# Patient Record
Sex: Male | Born: 1965 | Race: White | Hispanic: No | Marital: Married | Smoking: Never smoker
Health system: Southern US, Community
[De-identification: ages and names within clinical notes are randomized; demographics above are authoritative.]

## PROBLEM LIST (undated history)

## (undated) ENCOUNTER — Emergency Department: Discharge status: Absent without leave | Payer: Self-pay

## (undated) DIAGNOSIS — K219 Gastro-esophageal reflux disease without esophagitis: Secondary | ICD-10-CM

## (undated) DIAGNOSIS — I10 Essential (primary) hypertension: Secondary | ICD-10-CM

## (undated) DIAGNOSIS — F319 Bipolar disorder, unspecified: Secondary | ICD-10-CM

## (undated) DIAGNOSIS — G4730 Sleep apnea, unspecified: Secondary | ICD-10-CM

## (undated) HISTORY — PX: KNEE ARTHROSCOPY: SHX127

## (undated) HISTORY — PX: WOUND DEBRIDEMENT: SHX247

---

## 2005-09-24 ENCOUNTER — Emergency Department: Payer: Self-pay | Admitting: Emergency Medicine

## 2007-11-04 ENCOUNTER — Emergency Department: Payer: Self-pay | Admitting: Emergency Medicine

## 2009-04-04 ENCOUNTER — Ambulatory Visit: Payer: Self-pay | Admitting: Internal Medicine

## 2009-06-20 ENCOUNTER — Ambulatory Visit: Payer: Self-pay | Admitting: Family Medicine

## 2009-12-01 ENCOUNTER — Ambulatory Visit: Payer: Self-pay | Admitting: Internal Medicine

## 2010-03-02 ENCOUNTER — Ambulatory Visit: Payer: Self-pay | Admitting: Family Medicine

## 2010-05-15 ENCOUNTER — Ambulatory Visit: Payer: Self-pay | Admitting: Internal Medicine

## 2011-05-21 ENCOUNTER — Ambulatory Visit: Payer: Self-pay

## 2011-10-01 ENCOUNTER — Ambulatory Visit: Payer: Self-pay

## 2012-09-22 ENCOUNTER — Emergency Department: Payer: Self-pay | Admitting: Unknown Physician Specialty

## 2012-09-22 ENCOUNTER — Ambulatory Visit: Payer: Self-pay

## 2012-09-22 LAB — URINALYSIS, COMPLETE
Bilirubin,UR: NEGATIVE
Glucose,UR: NEGATIVE mg/dL (ref 0–75)
Ketone: NEGATIVE
Ph: 7 (ref 4.5–8.0)
Squamous Epithelial: NONE SEEN
WBC UR: NONE SEEN /HPF (ref 0–5)

## 2012-09-22 LAB — CBC
MCHC: 34 g/dL (ref 32.0–36.0)
MCV: 82 fL (ref 80–100)
RDW: 13.5 % (ref 11.5–14.5)

## 2012-09-22 LAB — COMPREHENSIVE METABOLIC PANEL
Alkaline Phosphatase: 60 U/L (ref 50–136)
Bilirubin,Total: 1.1 mg/dL — ABNORMAL HIGH (ref 0.2–1.0)
Calcium, Total: 8.9 mg/dL (ref 8.5–10.1)
Co2: 24 mmol/L (ref 21–32)
EGFR (African American): >60
Osmolality: 276 (ref 275–301)
SGOT(AST): 31 U/L (ref 15–37)
Sodium: 138 mmol/L (ref 136–145)
Total Protein: 7.4 g/dL (ref 6.4–8.2)

## 2012-09-22 LAB — TROPONIN I: Troponin-I: <0.02 ng/mL

## 2012-09-22 LAB — PRO B NATRIURETIC PEPTIDE: B-Type Natriuretic Peptide: 40 pg/mL (ref 0–125)

## 2012-10-03 ENCOUNTER — Ambulatory Visit: Payer: Self-pay | Admitting: Family Medicine

## 2013-01-05 ENCOUNTER — Ambulatory Visit: Payer: Self-pay | Admitting: Family Medicine

## 2013-04-29 ENCOUNTER — Ambulatory Visit: Payer: Self-pay | Admitting: Physician Assistant

## 2017-03-01 ENCOUNTER — Emergency Department: Payer: Self-pay

## 2017-03-01 ENCOUNTER — Emergency Department
Admission: EM | Admit: 2017-03-01 | Discharge: 2017-03-01 | Disposition: A | Discharge status: Home / Self Care | Payer: Self-pay | Attending: Emergency Medicine | Admitting: Emergency Medicine

## 2017-03-01 DIAGNOSIS — R079 Chest pain, unspecified: Secondary | ICD-10-CM | POA: Insufficient documentation

## 2017-03-01 DIAGNOSIS — R42 Dizziness and giddiness: Secondary | ICD-10-CM | POA: Insufficient documentation

## 2017-03-01 DIAGNOSIS — I160 Hypertensive urgency: Secondary | ICD-10-CM | POA: Insufficient documentation

## 2017-03-01 DIAGNOSIS — I10 Essential (primary) hypertension: Secondary | ICD-10-CM | POA: Insufficient documentation

## 2017-03-01 LAB — CBC
HCT: 41.5 % (ref 40.0–52.0)
Hemoglobin: 14.1 g/dL (ref 13.0–18.0)
MCH: 27.3 pg (ref 26.0–34.0)
MCHC: 34.1 g/dL (ref 32.0–36.0)
MCV: 79.9 fL — AB (ref 80.0–100.0)
PLATELETS: 231 10*3/uL (ref 150–440)
RBC: 5.19 MIL/uL (ref 4.40–5.90)
RDW: 14 % (ref 11.5–14.5)
WBC: 10.5 10*3/uL (ref 3.8–10.6)

## 2017-03-01 LAB — BASIC METABOLIC PANEL WITH GFR
Anion gap: 7 (ref 5–15)
BUN: 21 mg/dL — ABNORMAL HIGH (ref 6–20)
CO2: 25 mmol/L (ref 22–32)
Calcium: 8.7 mg/dL — ABNORMAL LOW (ref 8.9–10.3)
Chloride: 108 mmol/L (ref 101–111)
Creatinine, Ser: 1.31 mg/dL — ABNORMAL HIGH (ref 0.61–1.24)
GFR calc Af Amer: >60 mL/min
GFR calc non Af Amer: >60 mL/min
Glucose, Bld: 123 mg/dL — ABNORMAL HIGH (ref 65–99)
Potassium: 3.6 mmol/L (ref 3.5–5.1)
Sodium: 140 mmol/L (ref 135–145)

## 2017-03-01 LAB — TROPONIN I
Troponin I: <0.03 ng/mL
Troponin I: <0.03 ng/mL

## 2017-03-01 MED ORDER — SODIUM CHLORIDE 0.9 % IV BOLUS (SEPSIS)
1000.0000 mL | Freq: Once | INTRAVENOUS | Status: AC
  Administered 2017-03-01: 1000 mL via INTRAVENOUS

## 2017-03-01 MED ORDER — ASPIRIN 81 MG PO TABS: 81.0000 mg | ORAL_TABLET | Freq: Every day | ORAL | 0 refills | Status: AC

## 2017-03-01 MED ORDER — LISINOPRIL 10 MG PO TABS
10.0000 mg | ORAL_TABLET | Freq: Once | ORAL | Status: AC
  Administered 2017-03-01: 10 mg via ORAL
  Filled 2017-03-01: qty 1

## 2017-03-01 MED ORDER — ASPIRIN 81 MG PO CHEW
324.0000 mg | CHEWABLE_TABLET | Freq: Once | ORAL | Status: AC
  Administered 2017-03-01: 324 mg via ORAL
  Filled 2017-03-01: qty 4

## 2017-03-01 NOTE — ED Provider Notes (Signed)
[facility] Emergency Department Provider Note  ____________________________________________  Time seen: Approximately 3:53 AM  I have reviewed the triage vital signs and the nursing notes.   HISTORY  Chief Complaint Chest Pain   HPI Dale Sawyer is a 51 y.o. male with h/o HTN who presents for evaluation of CP. Patient reports that he was at work not doing anything exertional when he developed CP that he describes a pressure in the center of his chest, radiating to his L arm associated with dizziness and trouble taking a deep breath. The symptoms lasted about 1 hour and have now resolved. He is supposed to be on lisinopril however does not take it daily. He noticed his BP elevated at work. Last took lisinopril 2 days ago. No h/o smoking. Unknown family history as patient is adopted. Patient reports that he has had several similar episodes in the last 5 years. No changes in frequency or intensity. Has never seen a cardiologist.  PMH HTN  No past surgical history on file.  Prior to Admission medications   Medication Sig Start Date End Date Taking? Authorizing Provider  aspirin 81 MG tablet Take 1 tablet (81 mg total) by mouth daily. 03/01/17   Nita Sickle, MD    Allergies Penicillins  Family History Unknown as patient is adopted  Social History No history of smoking, drugs. Occasional alcohol  Review of Systems  Constitutional: Negative for fever. Eyes: Negative for visual changes. ENT: Negative for sore throat. Neck: No neck pain  Cardiovascular: + chest pain. Respiratory: + shortness of breath. Gastrointestinal: Negative for abdominal pain, vomiting or diarrhea. Genitourinary: Negative for dysuria. Musculoskeletal: Negative for back pain. Skin: Negative for rash. Neurological: Negative for headaches, weakness or numbness. Psych: No SI or HI  ____________________________________________   PHYSICAL EXAM:  VITAL SIGNS: ED  Triage Vitals  Enc Vitals Group     BP 03/01/17 0255 (!) 192/118     Pulse Rate 03/01/17 0255 94     Resp 03/01/17 0255 20     Temp 03/01/17 0309 98.3 F (36.8 C)     Temp Source 03/01/17 0309 Oral     SpO2 03/01/17 0309 93 %     Weight 03/01/17 0255 240 lb (108.9 kg)     Height 03/01/17 0255  (1.727 m)     Head Circumference --      Peak Flow --      Pain Score 03/01/17 0254 2     Pain Loc --      Pain Edu? --      Excl. in GC? --     Constitutional: Alert and oriented. Well appearing and in no apparent distress. HEENT:      Head: Normocephalic and atraumatic.         Eyes: Conjunctivae are normal. Sclera is non-icteric.       Mouth/Throat: Mucous membranes are moist.       Neck: Supple with no signs of meningismus. Cardiovascular: Regular rate and rhythm. No murmurs, gallops, or rubs. 2+ symmetrical distal pulses are present in all extremities. No JVD. Respiratory: Normal respiratory effort. Lungs are clear to auscultation bilaterally. No wheezes, crackles, or rhonchi.  Gastrointestinal: Soft, non tender, and non distended with positive bowel sounds. No rebound or guarding. Genitourinary: No CVA tenderness. Musculoskeletal: Nontender with normal range of motion in all extremities. No edema, cyanosis, or erythema of extremities. Neurologic: Normal speech and language. Face is symmetric. Moving all extremities. No gross focal neurologic deficits are  appreciated. Skin: Skin is warm, dry and intact. No rash noted. Psychiatric: Mood and affect are normal. Speech and behavior are normal.  ____________________________________________   LABS (all labs ordered are listed, but only abnormal results are displayed)  Labs Reviewed  BASIC METABOLIC PANEL - Abnormal; Notable for the following:       Result Value   Glucose, Bld 123 (*)    BUN 21 (*)    Creatinine, Ser 1.31 (*)    Calcium 8.7 (*)    All other components within normal limits  CBC - Abnormal; Notable for the  following:    MCV 79.9 (*)    All other components within normal limits  TROPONIN I  TROPONIN I   ____________________________________________  EKG  ED ECG REPORT I, Nita Sickle, the attending physician, personally viewed and interpreted this ECG.  Normal sinus rhythm, rate of 90, normal intervals, normal axis, no ST elevations or depressions, Q waves on inferior leads. Occasional PVCs. No prior for comparison.  ____________________________________________  RADIOLOGY  CXR: Negative  ____________________________________________   PROCEDURES  Procedure(s) performed: None Procedures Critical Care performed:  None ____________________________________________   INITIAL IMPRESSION / ASSESSMENT AND PLAN / ED COURSE  51 y.o. male with h/o HTN who presents for evaluation of CP. Patient is asymptomatic at this time. Presentation concerning for anginal pain. Heart score of 4. EKG with no evidence of ischemia. First troponin is negative. We'll monitor on telemetry, will give aspirin. Patient is asymptomatic at this time. Discussed admission for inpatient stress test however if second troponin is negative and patient remains pain-free and would like to have this workup done as an outpatient.    ----------------------------------------- 3:50 AM on 03/01/2017 ----------------------------------------- OBSERVATION CARE: This patient is being placed under observation care for the following reasons: Chest pain with repeat testing to rule out ischemia  ----------------------------------------- 5:20 AM on 03/01/2017 ----------------------------------------- patient remains extremely well appearing, no chest pain. First troponin is negative. Pending second troponin.  ----------------------------------------- 7:20 AM on 03/01/2017 ----------------------------------------- END OF OBSERVATION STATUS: After an appropriate period of observation, this patient is being discharged due to the  following reason(s):  workup negative with 2 negative troponins. Normal chest x-ray and normal labs. Patient remains asymptomatic with no chest pain. Patient be referred to cardiology for further evaluation for angina. Recommended return to the ER if patient develops any further episodes of chest pain in the meantime.    Pertinent labs & imaging results that were available during my care of the patient were reviewed by me and considered in my medical decision making (see chart for details).    ____________________________________________   FINAL CLINICAL IMPRESSION(S) / ED DIAGNOSES  Final diagnoses:  Chest pain, unspecified type  Hypertensive urgency      NEW MEDICATIONS STARTED DURING THIS VISIT:  New Prescriptions   ASPIRIN 81 MG TABLET    Take 1 tablet (81 mg total) by mouth daily.     Note:  This document was prepared using Dragon voice recognition software and may include unintentional dictation errors.    Don Perking, [place], MD 03/01/17 [phone]

## 2017-03-01 NOTE — ED Triage Notes (Signed)
Pt states that while working tonight he started having chest pain and difficulty catching his breath, pt reports checking his bp at work with a reading of 180/120, pt reports sporadically taking his bp meds

## 2017-03-01 NOTE — ED Notes (Signed)
Patient is asleep on the stretcher. Rise and fall of the chest noted. Patient is easily aroused when spoken to. Patient does not appear to be in acute distress at this time.   

## 2017-03-01 NOTE — Discharge Instructions (Signed)

## 2019-03-06 IMAGING — CR DG CHEST 2V
2 series · 2 of 2 positions shown · non-contrast
Comparison: 09/22/2012 chest radiograph

CLINICAL DATA: Chest pain and shortness of breath.

EXAM:
CHEST  2 VIEW

[chest pa]
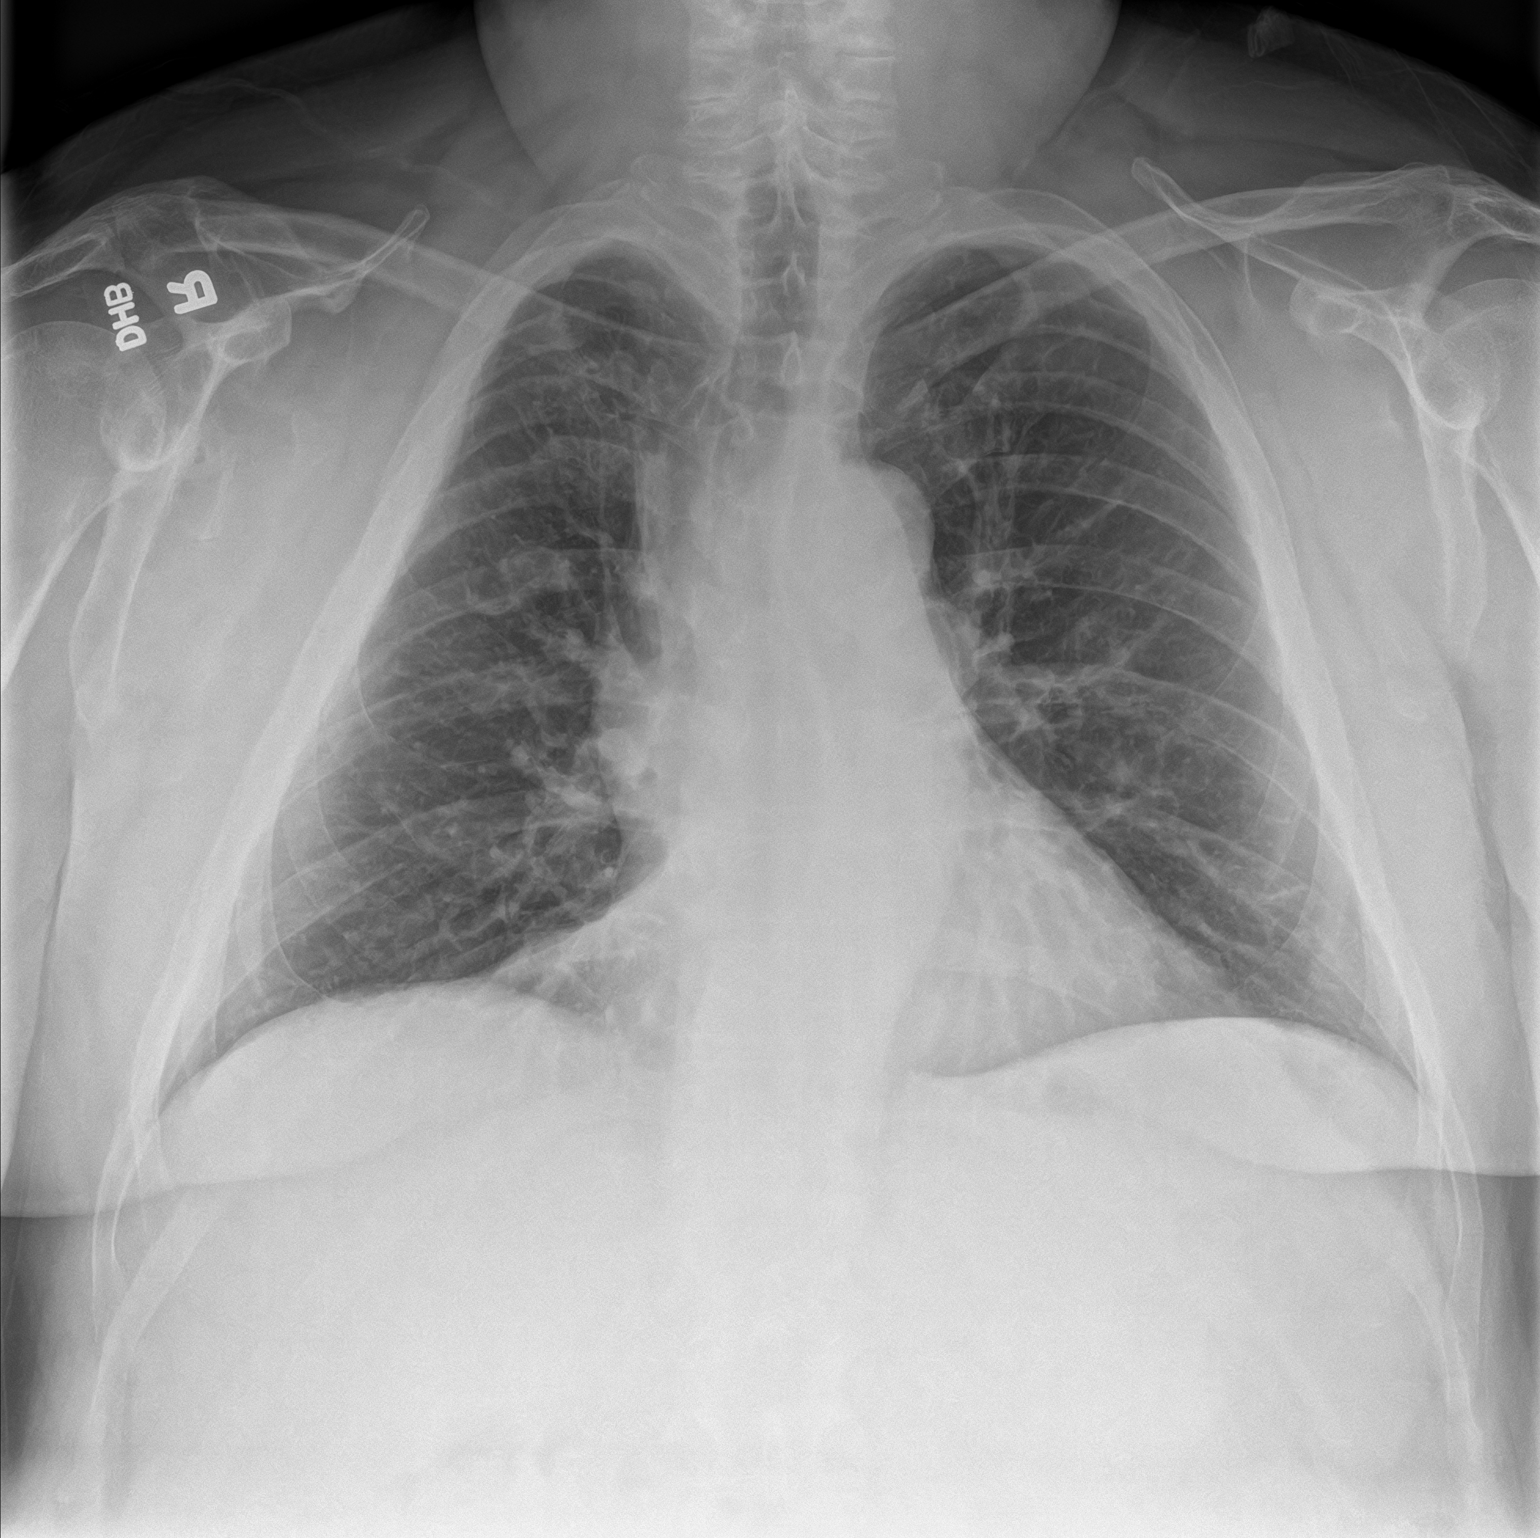

[chest lat]
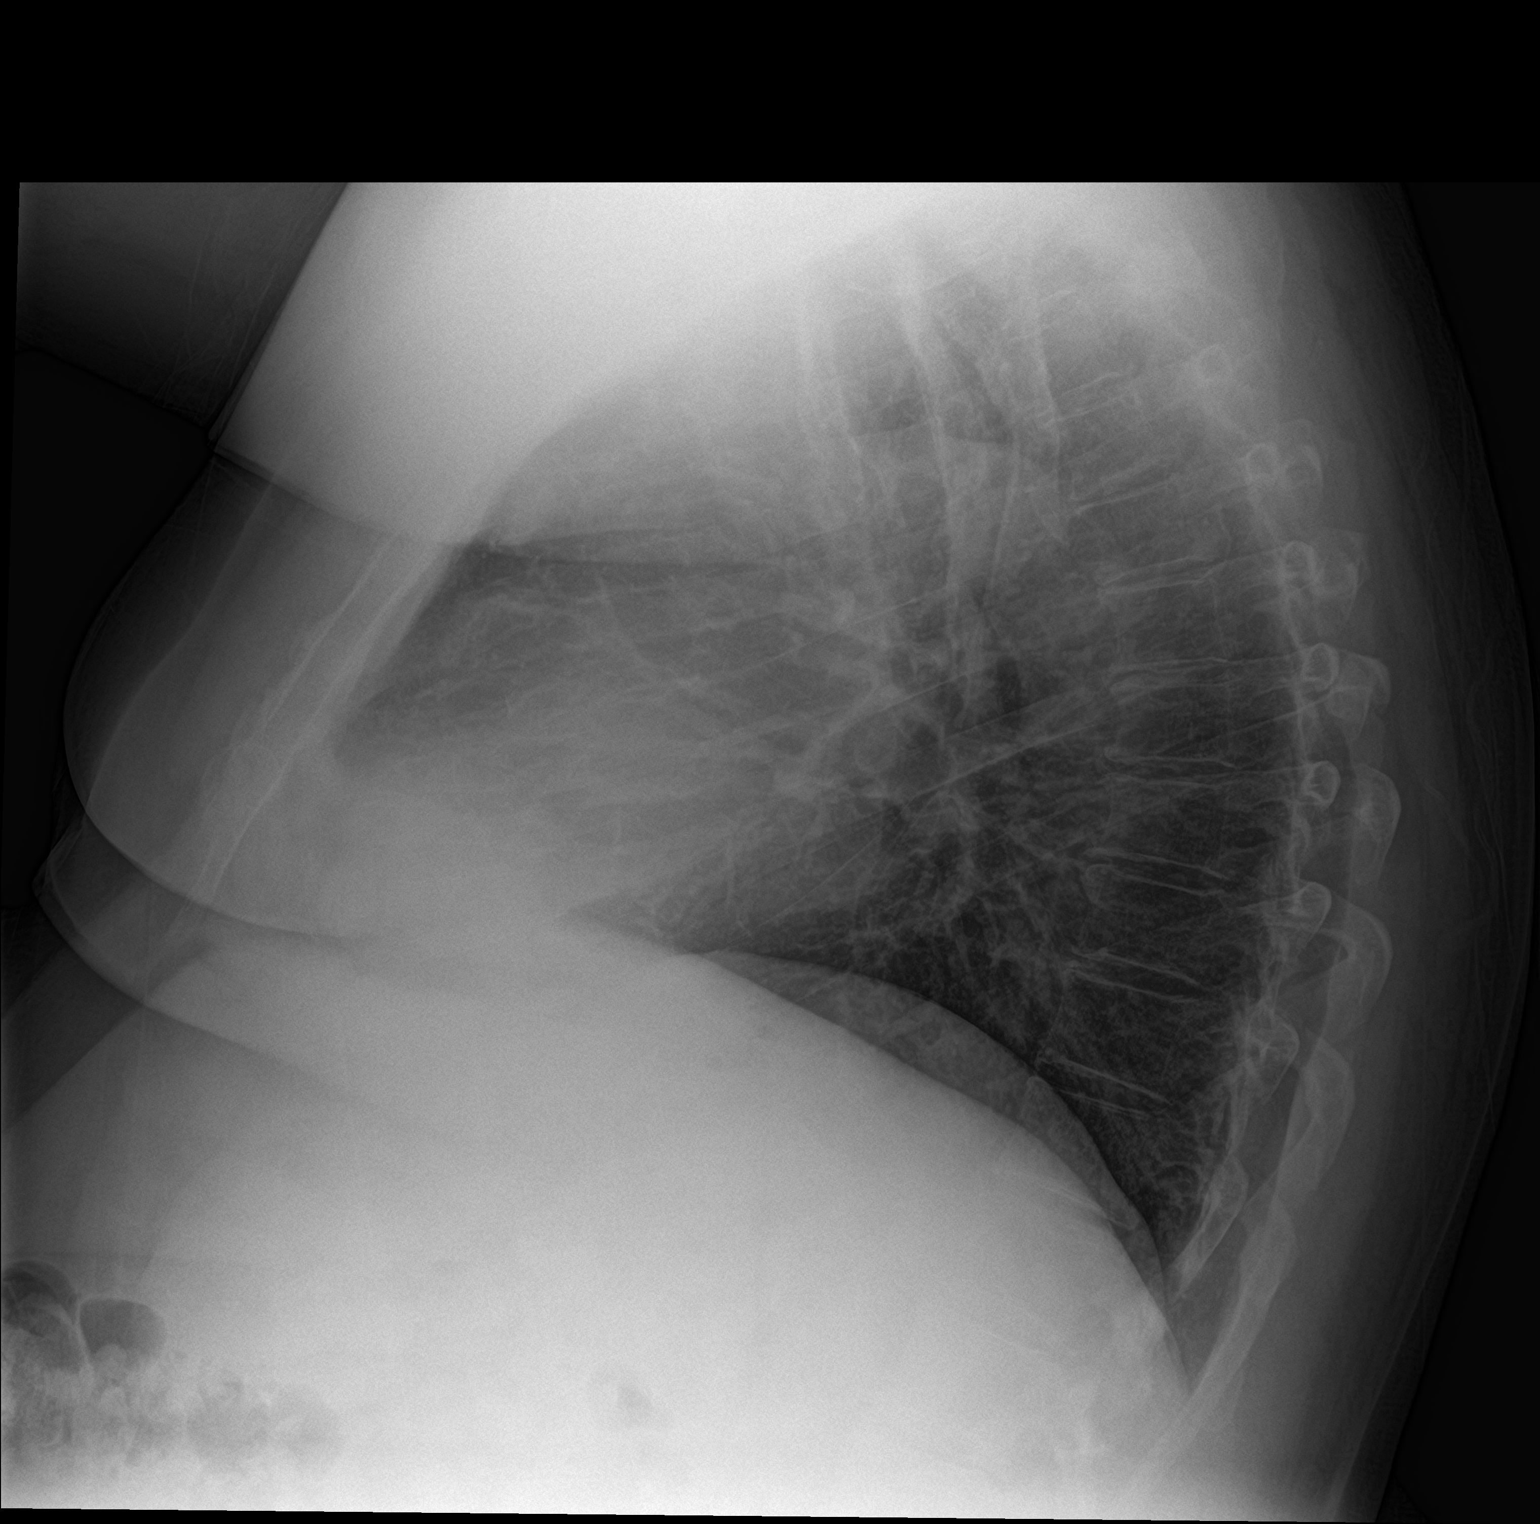

[2 of 2 positions shown; findings below may reference images not displayed]

FINDINGS: Multiple interval chronic right-sided rib fracture deformities and
deformity of the chest. Stable normal cardiac silhouette. No focal
consolidation. No pleural effusion or pneumothorax. No acute osseous
abnormality is evident.
IMPRESSION: No acute pulmonary process identified.

By: Olvin Antonio Okeefe M.D.

## 2019-08-31 ENCOUNTER — Ambulatory Visit: Payer: Self-pay | Attending: Internal Medicine

## 2019-08-31 DIAGNOSIS — Z23 Encounter for immunization: Secondary | ICD-10-CM

## 2019-08-31 NOTE — Progress Notes (Signed)
   Covid-19 Vaccination Clinic  Name:  Dale Sawyer    MRN: [id number] DOB: February 11, 1966  08/31/2019  [name] was observed post Covid-19 immunization for 15 minutes without incident. He was provided with Vaccine Information Sheet and instruction to access the V-Safe system.   [name] was instructed to call 911 with any severe reactions post vaccine: Marland Kitchen Difficulty breathing  . Swelling of face and throat  . A fast heartbeat  . A bad rash all over body  . Dizziness and weakness   Immunizations Administered    Name Date Dose VIS Date Route   Pfizer COVID-19 Vaccine 08/31/2019  1:46 PM 0.3 mL 05/15/2019 Intramuscular   Manufacturer: [employer], [employer]   Lot: [id number]   NDC: [address]-5

## 2019-09-23 ENCOUNTER — Ambulatory Visit: Payer: Self-pay | Attending: Internal Medicine

## 2019-09-23 DIAGNOSIS — Z23 Encounter for immunization: Secondary | ICD-10-CM

## 2019-09-23 NOTE — Progress Notes (Signed)
   Covid-19 Vaccination Clinic  Name:  Dale Sawyer    MRN: [id number] DOB: January 27, 1966  09/23/2019  [name] was observed post Covid-19 immunization for 15 minutes without incident. He was provided with Vaccine Information Sheet and instruction to access the V-Safe system.   [name] was instructed to call 911 with any severe reactions post vaccine: Marland Kitchen Difficulty breathing  . Swelling of face and throat  . A fast heartbeat  . A bad rash all over body  . Dizziness and weakness   Immunizations Administered    Name Date Dose VIS Date Route   Pfizer COVID-19 Vaccine 09/23/2019  2:46 PM 0.3 mL 07/29/2018 Intramuscular   Manufacturer: [employer], [employer]   Lot: [id number]   NDC: [address]-7

## 2020-06-17 ENCOUNTER — Other Ambulatory Visit: Payer: Self-pay

## 2020-06-17 ENCOUNTER — Emergency Department: Payer: Self-pay

## 2020-06-17 ENCOUNTER — Emergency Department
Admission: EM | Admit: 2020-06-17 | Discharge: 2020-06-17 | Disposition: A | Discharge status: Home / Self Care | Payer: Self-pay | Attending: Student in an Organized Health Care Education/Training Program | Admitting: Student in an Organized Health Care Education/Training Program

## 2020-06-17 DIAGNOSIS — R0602 Shortness of breath: Secondary | ICD-10-CM | POA: Insufficient documentation

## 2020-06-17 DIAGNOSIS — R030 Elevated blood-pressure reading, without diagnosis of hypertension: Secondary | ICD-10-CM | POA: Insufficient documentation

## 2020-06-17 DIAGNOSIS — Z5321 Procedure and treatment not carried out due to patient leaving prior to being seen by health care provider: Secondary | ICD-10-CM | POA: Insufficient documentation

## 2020-06-17 DIAGNOSIS — R251 Tremor, unspecified: Secondary | ICD-10-CM | POA: Insufficient documentation

## 2020-06-17 LAB — CBC
HCT: 43.5 % (ref 39.0–52.0)
Hemoglobin: 14.7 g/dL (ref 13.0–17.0)
MCH: 27.8 pg (ref 26.0–34.0)
MCHC: 33.8 g/dL (ref 30.0–36.0)
MCV: 82.4 fL (ref 80.0–100.0)
Platelets: 261 10*3/uL (ref 150–400)
RBC: 5.28 MIL/uL (ref 4.22–5.81)
RDW: 13.2 % (ref 11.5–15.5)
WBC: 12 10*3/uL — ABNORMAL HIGH (ref 4.0–10.5)
nRBC: 0 % (ref 0.0–0.2)

## 2020-06-17 LAB — COMPREHENSIVE METABOLIC PANEL
ALT: 41 U/L (ref 0–44)
AST: 22 U/L (ref 15–41)
Albumin: 4.4 g/dL (ref 3.5–5.0)
Alkaline Phosphatase: 47 U/L (ref 38–126)
Anion gap: 10 (ref 5–15)
BUN: 18 mg/dL (ref 6–20)
CO2: 25 mmol/L (ref 22–32)
Calcium: 8.5 mg/dL — ABNORMAL LOW (ref 8.9–10.3)
Chloride: 106 mmol/L (ref 98–111)
Creatinine, Ser: 1.11 mg/dL (ref 0.61–1.24)
GFR, Estimated: >60 mL/min (ref >60)
Glucose, Bld: 96 mg/dL (ref 70–99)
Potassium: 3.6 mmol/L (ref 3.5–5.1)
Sodium: 141 mmol/L (ref 135–145)
Total Bilirubin: 1.4 mg/dL — ABNORMAL HIGH (ref 0.3–1.2)
Total Protein: 7.8 g/dL (ref 6.5–8.1)

## 2020-06-17 LAB — TROPONIN I (HIGH SENSITIVITY): Troponin I (High Sensitivity): 5 ng/L (ref <18)

## 2020-06-17 NOTE — ED Triage Notes (Signed)
Pt states was at work and started feeling shaky and shob. States checked his bp and was elevated. Denies any recent illness or other complaints.

## 2021-01-12 LAB — COLOGUARD: COLOGUARD: NEGATIVE

## 2021-01-12 LAB — EXTERNAL GENERIC LAB PROCEDURE: COLOGUARD: NEGATIVE

## 2021-03-01 ENCOUNTER — Ambulatory Visit (INDEPENDENT_AMBULATORY_CARE_PROVIDER_SITE_OTHER): Payer: PRIVATE HEALTH INSURANCE | Admitting: Urology

## 2021-03-01 ENCOUNTER — Other Ambulatory Visit: Payer: Self-pay

## 2021-03-01 ENCOUNTER — Encounter: Payer: Self-pay | Admitting: Urology

## 2021-03-01 VITALS — BP 172/114 | HR 90 | Ht 68.0 in | Wt 255.0 lb

## 2021-03-01 DIAGNOSIS — N509 Disorder of male genital organs, unspecified: Secondary | ICD-10-CM

## 2021-03-01 DIAGNOSIS — R3129 Other microscopic hematuria: Secondary | ICD-10-CM | POA: Diagnosis not present

## 2021-03-01 DIAGNOSIS — N433 Hydrocele, unspecified: Secondary | ICD-10-CM

## 2021-03-01 NOTE — H&P (View-Only) (Signed)
   03/01/2021 7:10 AM   Dale Sawyer 09/26/1965 [id number]  Referring provider: Anderson, Marshall W, MD [address] [facility] [place],  [place] [address]  Chief Complaint  Patient presents with   Hydrocele    HPI: Dale Sawyer is a 55 y.o. male referred for hydrocele evaluation.  2-year history of "growth" posterior scrotum Increasing size the last 2 years and has become bothersome No bothersome LUTS Denies dysuria, gross hematuria  PMH: Hypertension  Surgical History: History reviewed. No pertinent surgical history.  Home Medications:  Allergies as of 03/01/2021       Reactions   Lisinopril Cough   Penicillins         Medication List        Accurate as of March 01, 2021 11:59 PM. If you have any questions, ask your nurse or doctor.          aspirin 81 MG tablet Take 1 tablet (81 mg total) by mouth daily.   colchicine 0.6 MG tablet Take 2 tablets (1.2mg) by mouth at first sign of gout flare followed by 1 tablet (0.6mg) after 1 hour. (Max 1.8mg within 1 hour)   losartan-hydrochlorothiazide 100-12.5 MG tablet Commonly known as: HYZAAR Take 1 tablet by mouth daily.   pantoprazole 40 MG tablet Commonly known as: PROTONIX Take 40 mg by mouth daily.        Allergies:  Allergies  Allergen Reactions   Lisinopril Cough   Penicillins     Family History: History reviewed. No pertinent family history.  Social History:  reports that he has never smoked. He has never used smokeless tobacco. No history on file for alcohol use and drug use.   Physical Exam: BP (!) 172/114   Pulse 90   Ht 5' 8" (1.727 m)   Wt 255 lb (115.7 kg)   BMI 38.77 kg/m   Constitutional:  Alert and oriented, No acute distress. HEENT: [place] AT, moist mucus membranes.  Trachea midline, no masses. Cardiovascular: RRR Respiratory: Normal respiratory effort, clear GI: Abdomen is soft, nontender, nondistended, no abdominal masses GU: Phallus  without lesions, testes descended bilaterally without masses or tenderness.  Right posterior scrotum/perineal area is an ~2 cm skin tag with broad stalk Psychiatric: Normal mood and affect.   Assessment & Plan:   55 y.o. male with a large right posterior scrotal/perineal skin tag who desires excision This could be performed in the office under local or in same-day surgery under sedation.  He has elected the latter. The procedure was discussed in detail including potential risks of bleeding and infection. Postoperative care was discussed. Urinalysis also showed 3-10 RBCs per high-power field.  AUA hematuria risk stratification-intermediate.  Will schedule renal ultrasound and recommend cystoscopy at the time of scrotal skin lesion excision   Keyaira Clapham C Huxton Glaus, MD  [facility] [address], [address] [place], [place] [address] [phone]   

## 2021-03-01 NOTE — Progress Notes (Signed)
   03/01/2021 7:10 AM   Dale Sawyer March 08, 1966 [id number]  Referring provider: Lauro Regulus, MD [address] [facility] [place] [facility] [place],  [place] [address]  Chief Complaint  Patient presents with   Hydrocele    HPI: Dale Sawyer is a 55 y.o. male referred for hydrocele evaluation.  2-year history of "growth" posterior scrotum Increasing size the last 2 years and has become bothersome No bothersome LUTS Denies dysuria, gross hematuria  PMH: Hypertension  Surgical History: History reviewed. No pertinent surgical history.  Home Medications:  Allergies as of 03/01/2021       Reactions   Lisinopril Cough   Penicillins         Medication List        Accurate as of March 01, 2021 11:59 PM. If you have any questions, ask your nurse or doctor.          aspirin 81 MG tablet Take 1 tablet (81 mg total) by mouth daily.   colchicine 0.6 MG tablet Take 2 tablets (1.2mg ) by mouth at first sign of gout flare followed by 1 tablet (0.6mg ) after 1 hour. (Max 1.8mg  within 1 hour)   losartan-hydrochlorothiazide 100-12.5 MG tablet Commonly known as: HYZAAR Take 1 tablet by mouth daily.   pantoprazole 40 MG tablet Commonly known as: PROTONIX Take 40 mg by mouth daily.        Allergies:  Allergies  Allergen Reactions   Lisinopril Cough   Penicillins     Family History: History reviewed. No pertinent family history.  Social History:  reports that he has never smoked. He has never used smokeless tobacco. No history on file for alcohol use and drug use.   Physical Exam: BP (!) 172/114   Pulse 90   Ht 5\' 8"  (1.727 m)   Wt 255 lb (115.7 kg)   BMI 38.77 kg/m   Constitutional:  Alert and oriented, No acute distress. HEENT: [name] AT, moist mucus membranes.  Trachea midline, no masses. Cardiovascular: RRR Respiratory: Normal respiratory effort, clear GI: Abdomen is soft, nontender, nondistended, no abdominal masses GU: Phallus  without lesions, testes descended bilaterally without masses or tenderness.  Right posterior scrotum/perineal area is an ~2 cm skin tag with broad stalk Psychiatric: Normal mood and affect.   Assessment & Plan:   55 y.o. male with a large right posterior scrotal/perineal skin tag who desires excision This could be performed in the office under local or in same-day surgery under sedation.  He has elected the latter. The procedure was discussed in detail including potential risks of bleeding and infection. Postoperative care was discussed. Urinalysis also showed 3-10 RBCs per high-power field.  AUA hematuria risk stratification-intermediate.  Will schedule renal ultrasound and recommend cystoscopy at the time of scrotal skin lesion excision   58, MD  [facility] [address], [address] [place], [place] [phone]

## 2021-03-02 LAB — URINALYSIS, COMPLETE
Bilirubin, UA: NEGATIVE
Glucose, UA: NEGATIVE
Ketones, UA: NEGATIVE
Leukocytes,UA: NEGATIVE
Nitrite, UA: NEGATIVE
Protein,UA: NEGATIVE
Specific Gravity, UA: 1.025 (ref 1.005–1.030)
Urobilinogen, Ur: 0.2 mg/dL (ref 0.2–1.0)
pH, UA: 5.5 (ref 5.0–7.5)

## 2021-03-02 LAB — MICROSCOPIC EXAMINATION
Bacteria, UA: NONE SEEN
Epithelial Cells (non renal): NONE SEEN /hpf (ref 0–10)

## 2021-03-05 ENCOUNTER — Telehealth: Payer: Self-pay | Admitting: Urology

## 2021-03-05 ENCOUNTER — Other Ambulatory Visit: Payer: Self-pay | Admitting: Urology

## 2021-03-05 DIAGNOSIS — R3129 Other microscopic hematuria: Secondary | ICD-10-CM

## 2021-03-05 DIAGNOSIS — N509 Disorder of male genital organs, unspecified: Secondary | ICD-10-CM

## 2021-03-05 NOTE — Progress Notes (Signed)
Surgical Physician Order Form  ** Scheduling expectation : Next Available  *Length of Case: 45 minutes  *Clearance needed: no  *Anticoagulation Instructions: N/A  *Aspirin Instructions: Hold Aspirin  *Post-op visit Date/Instructions:  1 month follow up  *Diagnosis:  1.  Scrotal skin lesion 2.  Microhematuria  *Procedure:  1.  Excision of scrotal skin lesion 2.  Flexible cystoscopy  -Admit type: OUTpatient  -Anesthesia: MAC  -VTE Prophylaxis Standing Order SCD's       Other:   -Standing Lab Orders Per Anesthesia    Lab other: None  -Standing Test orders EKG/Chest x-ray per Anesthesia       Test other:   - Medications:     Ancef 2gm IV (okay with penicillin allergy)   Other Instructions:

## 2021-03-05 NOTE — Telephone Encounter (Signed)
Please let patient know his urinalysis did show microscopic blood in the urine which is considered abnormal.  I have placed an order for a renal ultrasound and would also recommend cystoscopy at the time of his scrotal surgery.  Please let me know if he has any questions.

## 2021-03-06 NOTE — Telephone Encounter (Signed)
Notified patient as instructed, patient pleased. Discussed follow-up appointments, patient agrees  

## 2021-03-07 ENCOUNTER — Telehealth: Payer: Self-pay | Admitting: Urology

## 2021-03-07 ENCOUNTER — Encounter: Payer: Self-pay | Admitting: Urology

## 2021-03-07 NOTE — Telephone Encounter (Signed)
Per Dr. Lonna Cobb Patient is to be scheduled for Excision of scrotal skin lesion, and a Flexible Cystoscopy.  Mr. Dale Sawyer was contacted and possible surgical dates were discussed, 03/14/21 was agreed upon for surgery.   Patient was directed to call [phone] between 1-3pm the day before surgery to find out surgical arrival time.  Instructions were given not to eat or drink from midnight on the night before surgery and have a driver for the day of surgery. On the surgery day patient was instructed to enter through the Medical Mall entrance of [facility] report the Same Day Surgery desk.   Pre-Admit Testing will be in contact via phone to set up an interview with the anesthesia team to review your history and medications prior to surgery.   Reminder of this information was sent via mychart and mail to the patient.   Patient is not to hold anticoag's per Dr. Lonna Cobb.   Currently taking ASA 81mg . Pt. Needs to hold this 7 days prior to surgery.

## 2021-03-07 NOTE — Progress Notes (Signed)
[facility] Posting Form   Surgery Date/Time: Date: 03/14/2021  Surgeon: Dr. Irineo Axon, MD  Surgery Location: Day Surgery  Inpt ( No  )   Outpt (Yes)   Obs ( No  )   Diagnosis: N50.9 Scrotal Skin Lesion; R31.29 Microhematuria    -CPT: 55150  Surgery: Excision of Scrotal Skin Lesion  -CPT: 52000  Surgery: Flexible Cystoscopy  Stop Anticoagulations: No; hold aspirin  Cardiac/Medical/Pulmonary Clearance needed: None needed  *Orders entered into EPIC  Date: 03/07/21   *Case booked in EPIC  Date: 03/07/21  *Notified pt of Surgery: Date: 03/07/21  PRE-OP UA & CX: None  *Placed into Prior Authorization Work Que Date: 03/07/21   Assistant/laser/rep:No

## 2021-03-07 NOTE — Telephone Encounter (Signed)
Orders for surgery entered and sent to Pre-Admit.

## 2021-03-09 ENCOUNTER — Encounter
Admission: RE | Admit: 2021-03-09 | Discharge: 2021-03-09 | Disposition: A | Discharge status: Home / Self Care | Payer: PRIVATE HEALTH INSURANCE | Source: Ambulatory Visit | Attending: Urology | Admitting: Urology

## 2021-03-09 ENCOUNTER — Other Ambulatory Visit: Payer: Self-pay

## 2021-03-09 HISTORY — DX: Bipolar disorder, unspecified: F31.9

## 2021-03-09 HISTORY — DX: Essential (primary) hypertension: I10

## 2021-03-09 HISTORY — DX: Gastro-esophageal reflux disease without esophagitis: K21.9

## 2021-03-09 HISTORY — DX: Sleep apnea, unspecified: G47.30

## 2021-03-09 NOTE — Patient Instructions (Signed)
Your procedure is scheduled on: 03/14/21 Report to DAY SURGERY DEPARTMENT LOCATED ON 2ND FLOOR MEDICAL MALL ENTRANCE. To find out your arrival time please call [phone] between 1PM - 3PM on 03/13/21.  Remember: Instructions that are not followed completely may result in serious medical risk, up to and including death, or upon the discretion of your surgeon and anesthesiologist your surgery may need to be rescheduled.     _X__ 1. Do not eat food OR DRINK LIQUIDS after midnight the night before your procedure.                 No gum chewing or hard candies.   __X__2.  On the morning of surgery brush your teeth with toothpaste and water, you                 may rinse your mouth with mouthwash if you wish.  Do not swallow any              toothpaste of mouthwash.     _X__ 3.  No Alcohol for 24 hours before or after surgery.   _X__ 4.  Do Not Smoke or use e-cigarettes For 24 Hours Prior to Your Surgery.                 Do not use any chewable tobacco products for at least 6 hours prior to                 surgery.  ____  5.  Bring all medications with you on the day of surgery if instructed.   __X__  6.  Notify your doctor if there is any change in your medical condition      (cold, fever, infections).     Do not wear jewelry, make-up, hairpins, clips or nail polish. Do not wear lotions, powders, or perfumes.  Do not shave body hair 48 hours prior to surgery. Men may shave face and neck. Do not bring valuables to the hospital.    [facility] is not responsible for any belongings or valuables.  Contacts, dentures/partials or body piercings may not be worn into surgery. Bring a case for your contacts, glasses or hearing aids, a denture cup will be supplied. Leave your suitcase in the car. After surgery it may be brought to your room. For patients admitted to the hospital, discharge time is determined by your treatment team.   Patients discharged the day of surgery will not be allowed  to drive home.   Please read over the following fact sheets that you were given:     __X__ Take these medicines the morning of surgery with A SIP OF WATER:    1. lamoTRIgine (LAMICTAL) 200 MG tablet  2. pantoprazole (PROTONIX) 40 MG tablet  3.   4.  5.  6.  ____ Fleet Enema (as directed)   ____ Use CHG Soap/SAGE wipes as directed  ____ Use inhalers on the day of surgery  ____ Stop metformin/Janumet/Farxiga 2 days prior to surgery    ____ Take 1/2 of usual insulin dose the night before surgery. No insulin the morning          of surgery.   ____ Stop Blood Thinners Coumadin/Plavix/Xarelto/Pleta/Pradaxa/Eliquis/Effient/Aspirin  on   Or contact your Surgeon, Cardiologist or Medical Doctor regarding  ability to stop your blood thinners  __X__ Stop Anti-inflammatories 7 days before surgery such as Advil, Ibuprofen, Motrin,  BC or Goodies Powder, Naprosyn, Naproxen, Aleve, Aspirin    __X__ Stop  all herbal supplements, fish oil or vitamin E until after surgery.    ____ Bring C-Pap to the hospital.    HOLD ASPIRIN UNTIL AFTER SURGERY

## 2021-03-10 ENCOUNTER — Other Ambulatory Visit
Admission: RE | Admit: 2021-03-10 | Discharge: 2021-03-10 | Disposition: A | Discharge status: Home / Self Care | Payer: PRIVATE HEALTH INSURANCE | Source: Ambulatory Visit | Attending: Urology | Admitting: Urology

## 2021-03-10 DIAGNOSIS — I10 Essential (primary) hypertension: Secondary | ICD-10-CM | POA: Insufficient documentation

## 2021-03-10 DIAGNOSIS — Z01818 Encounter for other preprocedural examination: Secondary | ICD-10-CM | POA: Insufficient documentation

## 2021-03-10 LAB — BASIC METABOLIC PANEL
Anion gap: 10 (ref 5–15)
BUN: 23 mg/dL — ABNORMAL HIGH (ref 6–20)
CO2: 27 mmol/L (ref 22–32)
Calcium: 9.4 mg/dL (ref 8.9–10.3)
Chloride: 101 mmol/L (ref 98–111)
Creatinine, Ser: 1.26 mg/dL — ABNORMAL HIGH (ref 0.61–1.24)
GFR, Estimated: >60 mL/min (ref >60)
Glucose, Bld: 96 mg/dL (ref 70–99)
Potassium: 3.7 mmol/L (ref 3.5–5.1)
Sodium: 138 mmol/L (ref 135–145)

## 2021-03-14 ENCOUNTER — Encounter: Payer: Self-pay | Admitting: Urology

## 2021-03-14 ENCOUNTER — Other Ambulatory Visit: Payer: Self-pay

## 2021-03-14 ENCOUNTER — Encounter
Admission: RE | Disposition: A | Discharge status: Home / Self Care | Payer: Self-pay | Source: Home / Self Care | Attending: Urology

## 2021-03-14 ENCOUNTER — Ambulatory Visit
Admission: RE | Admit: 2021-03-14 | Discharge: 2021-03-14 | Disposition: A | Discharge status: Home / Self Care | Payer: 59 | Attending: Urology | Admitting: Urology

## 2021-03-14 ENCOUNTER — Ambulatory Visit: Payer: 59 | Admitting: Anesthesiology

## 2021-03-14 DIAGNOSIS — G4730 Sleep apnea, unspecified: Secondary | ICD-10-CM | POA: Diagnosis not present

## 2021-03-14 DIAGNOSIS — K219 Gastro-esophageal reflux disease without esophagitis: Secondary | ICD-10-CM | POA: Insufficient documentation

## 2021-03-14 DIAGNOSIS — L989 Disorder of the skin and subcutaneous tissue, unspecified: Secondary | ICD-10-CM | POA: Diagnosis not present

## 2021-03-14 DIAGNOSIS — Z79899 Other long term (current) drug therapy: Secondary | ICD-10-CM | POA: Insufficient documentation

## 2021-03-14 DIAGNOSIS — Z880 Allergy status to penicillin: Secondary | ICD-10-CM | POA: Diagnosis not present

## 2021-03-14 DIAGNOSIS — N433 Hydrocele, unspecified: Secondary | ICD-10-CM | POA: Diagnosis present

## 2021-03-14 DIAGNOSIS — N509 Disorder of male genital organs, unspecified: Secondary | ICD-10-CM

## 2021-03-14 DIAGNOSIS — R3129 Other microscopic hematuria: Secondary | ICD-10-CM | POA: Insufficient documentation

## 2021-03-14 DIAGNOSIS — L918 Other hypertrophic disorders of the skin: Secondary | ICD-10-CM | POA: Diagnosis not present

## 2021-03-14 DIAGNOSIS — Z888 Allergy status to other drugs, medicaments and biological substances status: Secondary | ICD-10-CM | POA: Diagnosis not present

## 2021-03-14 DIAGNOSIS — Z7982 Long term (current) use of aspirin: Secondary | ICD-10-CM | POA: Insufficient documentation

## 2021-03-14 DIAGNOSIS — I10 Essential (primary) hypertension: Secondary | ICD-10-CM | POA: Diagnosis not present

## 2021-03-14 HISTORY — PX: SCROTAL EXPLORATION: SHX2386

## 2021-03-14 HISTORY — PX: CYSTOSCOPY: SHX5120

## 2021-03-14 SURGERY — EXPLORATION, SCROTUM
Anesthesia: General

## 2021-03-14 MED ORDER — PROPOFOL 500 MG/50ML IV EMUL
INTRAVENOUS | Status: AC
  Filled 2021-03-14: qty 50

## 2021-03-14 MED ORDER — PROPOFOL 500 MG/50ML IV EMUL
INTRAVENOUS | Status: DC | PRN
  Administered 2021-03-14: 150 ug/kg/min via INTRAVENOUS

## 2021-03-14 MED ORDER — CEFAZOLIN SODIUM-DEXTROSE 2-4 GM/100ML-% IV SOLN
2.0000 g | INTRAVENOUS | Status: AC
  Administered 2021-03-14: 2 g via INTRAVENOUS

## 2021-03-14 MED ORDER — FENTANYL CITRATE (PF) 100 MCG/2ML IJ SOLN
INTRAMUSCULAR | Status: DC | PRN
  Administered 2021-03-14: 25 ug via INTRAVENOUS

## 2021-03-14 MED ORDER — LACTATED RINGERS IV SOLN: INTRAVENOUS | Status: DC

## 2021-03-14 MED ORDER — ORAL CARE MOUTH RINSE: 15.0000 mL | Freq: Once | OROMUCOSAL | Status: AC

## 2021-03-14 MED ORDER — ACETAMINOPHEN 10 MG/ML IV SOLN
INTRAVENOUS | Status: DC | PRN
  Administered 2021-03-14: 1000 mg via INTRAVENOUS

## 2021-03-14 MED ORDER — LIDOCAINE HCL (PF) 1 % IJ SOLN
INTRAMUSCULAR | Status: AC
  Filled 2021-03-14: qty 30

## 2021-03-14 MED ORDER — LIDOCAINE HCL (PF) 2 % IJ SOLN
INTRAMUSCULAR | Status: AC
  Filled 2021-03-14: qty 5

## 2021-03-14 MED ORDER — CHLORHEXIDINE GLUCONATE 0.12 % MT SOLN: 15.0000 mL | Freq: Once | OROMUCOSAL | Status: AC

## 2021-03-14 MED ORDER — FENTANYL CITRATE (PF) 100 MCG/2ML IJ SOLN
INTRAMUSCULAR | Status: AC
  Filled 2021-03-14: qty 2

## 2021-03-14 MED ORDER — PROPOFOL 10 MG/ML IV BOLUS
INTRAVENOUS | Status: DC | PRN
  Administered 2021-03-14: 50 mg via INTRAVENOUS

## 2021-03-14 MED ORDER — MIDAZOLAM HCL 2 MG/2ML IJ SOLN
INTRAMUSCULAR | Status: DC | PRN
  Administered 2021-03-14: 2 mg via INTRAVENOUS

## 2021-03-14 MED ORDER — CEFAZOLIN SODIUM-DEXTROSE 2-4 GM/100ML-% IV SOLN
INTRAVENOUS | Status: AC
  Filled 2021-03-14: qty 100

## 2021-03-14 MED ORDER — FENTANYL CITRATE (PF) 100 MCG/2ML IJ SOLN: 25.0000 ug | INTRAMUSCULAR | Status: DC | PRN

## 2021-03-14 MED ORDER — CHLORHEXIDINE GLUCONATE 0.12 % MT SOLN
OROMUCOSAL | Status: AC
  Administered 2021-03-14: 15 mL via OROMUCOSAL
  Filled 2021-03-14: qty 15

## 2021-03-14 MED ORDER — ACETAMINOPHEN 10 MG/ML IV SOLN
INTRAVENOUS | Status: AC
  Filled 2021-03-14: qty 100

## 2021-03-14 MED ORDER — MIDAZOLAM HCL 2 MG/2ML IJ SOLN
INTRAMUSCULAR | Status: AC
  Filled 2021-03-14: qty 2

## 2021-03-14 MED ORDER — HYDROCODONE-ACETAMINOPHEN 5-325 MG PO TABS: 1.0000 | ORAL_TABLET | Freq: Four times a day (QID) | ORAL | 0 refills | Status: AC | PRN

## 2021-03-14 SURGICAL SUPPLY — 27 items
ADH SKN CLS APL DERMABOND .7 (GAUZE/BANDAGES/DRESSINGS) ×2
BAG DRAIN CYSTO-URO LG1000N (MISCELLANEOUS) IMPLANT
BAG DRN 6X6 URO DRBLE ADPR (MISCELLANEOUS) ×1
BAG INFUSER PRESSURE 100CC (MISCELLANEOUS) ×2 IMPLANT
BAG URINE DRAIN UROCATCH STRL (MISCELLANEOUS) ×2 IMPLANT
DERMABOND ADVANCED (GAUZE/BANDAGES/DRESSINGS) ×2
DERMABOND ADVANCED .7 DNX12 (GAUZE/BANDAGES/DRESSINGS) ×2 IMPLANT
ELECT LOOP 22F BIPOLAR SML (ELECTROSURGICAL)
ELECTRODE LOOP 22F BIPOLAR SML (ELECTROSURGICAL) IMPLANT
GAUZE 4X4 16PLY ~~LOC~~+RFID DBL (SPONGE) ×4 IMPLANT
GOWN STRL REUS W/ TWL XL LVL3 (GOWN DISPOSABLE) ×1 IMPLANT
GOWN STRL REUS W/TWL XL LVL3 (GOWN DISPOSABLE) ×2
IV NS 1000ML (IV SOLUTION) ×2
IV NS 1000ML BAXH (IV SOLUTION) ×1 IMPLANT
KIT TURNOVER CYSTO (KITS) ×2 IMPLANT
MANIFOLD NEPTUNE II (INSTRUMENTS) ×2 IMPLANT
PACK CYSTO AR (MISCELLANEOUS) ×2 IMPLANT
SET CYSTO W/LG BORE CLAMP LF (SET/KITS/TRAYS/PACK) ×2 IMPLANT
SURGILUBE 2OZ TUBE FLIPTOP (MISCELLANEOUS) ×2 IMPLANT
SUT CHROMIC 0 SH (SUTURE) ×2 IMPLANT
SYR 10ML LL (SYRINGE) ×2 IMPLANT
SYR TOOMEY IRRIG 70ML (MISCELLANEOUS) ×2
SYRINGE TOOMEY IRRIG 70ML (MISCELLANEOUS) ×1 IMPLANT
WATER STERILE IRR 1000ML POUR (IV SOLUTION) ×2 IMPLANT
WATER STERILE IRR 3000ML UROMA (IV SOLUTION) IMPLANT
WATER STERILE IRR 500ML POUR (IV SOLUTION) ×2 IMPLANT
urology drain bag ×2 IMPLANT

## 2021-03-14 NOTE — Anesthesia Preprocedure Evaluation (Signed)
Anesthesia Evaluation  Patient identified by MRN, date of birth, ID band Patient awake    Reviewed: Allergy & Precautions, H&P , NPO status , Patient's Chart, lab work & pertinent test results, reviewed documented beta blocker date and time   History of Anesthesia Complications Negative for: history of anesthetic complications  Airway Mallampati: II  TM Distance: >3 FB Neck ROM: full    Dental  (+) Dental Advidsory Given, Teeth Intact, Chipped   Pulmonary neg shortness of breath, sleep apnea , neg COPD, neg recent URI,    Pulmonary exam normal breath sounds clear to auscultation       Cardiovascular Exercise Tolerance: Good hypertension, (-) angina(-) Past MI Normal cardiovascular exam(-) dysrhythmias (-) Valvular Problems/Murmurs Rhythm:regular Rate:Normal     Neuro/Psych PSYCHIATRIC DISORDERS Bipolar Disorder negative neurological ROS     GI/Hepatic Neg liver ROS, GERD  ,  Endo/Other  negative endocrine ROS  Renal/GU negative Renal ROS  negative genitourinary   Musculoskeletal   Abdominal   Peds  Hematology negative hematology ROS (+)   Anesthesia Other Findings Past Medical History: No date: Bipolar disorder (HCC) No date: GERD (gastroesophageal reflux disease) No date: Hypertension No date: Sleep apnea   Reproductive/Obstetrics negative OB ROS                             Anesthesia Physical Anesthesia Plan  ASA: 2  Anesthesia Plan: General   Post-op Pain Management:    Induction: Intravenous  PONV Risk Score and Plan: 2 and Treatment may vary due to age or medical condition, TIVA and Propofol infusion  Airway Management Planned: Simple Face Mask and Natural Airway  Additional Equipment:   Intra-op Plan:   Post-operative Plan:   Informed Consent: I have reviewed the patients History and Physical, chart, labs and discussed the procedure including the risks, benefits  and alternatives for the proposed anesthesia with the patient or authorized representative who has indicated his/her understanding and acceptance.     Dental Advisory Given  Plan Discussed with: Anesthesiologist, CRNA and Surgeon  Anesthesia Plan Comments:         Anesthesia Quick Evaluation

## 2021-03-14 NOTE — Transfer of Care (Signed)
Immediate Anesthesia Transfer of Care Note  Patient: Dale Sawyer  Procedure(s) Performed: SCROTUM EXPLORATION FLEXIBLE CYSTOSCOPY  Patient Location: PACU  Anesthesia Type:General  Level of Consciousness: awake and alert   Airway & Oxygen Therapy: Patient Spontanous Breathing and Patient connected to face mask oxygen  Post-op Assessment: Report given to RN and Post -op Vital signs reviewed and stable  Post vital signs: Reviewed and stable  Last Vitals:  Vitals Value Taken Time  BP 141/100 03/14/21 1249  Temp    Pulse 100 03/14/21 1250  Resp 12 03/14/21 1250  SpO2 91 % 03/14/21 1250  Vitals shown include unvalidated device data.  Last Pain:  Vitals:   03/14/21 0918  TempSrc: Tympanic  PainSc: 0-No pain         Complications: No notable events documented.

## 2021-03-14 NOTE — Discharge Instructions (Addendum)
Discharge instructions:  Dermabond (medical glue) was placed over your incision and you do not need to apply any ointments Sutures will dissolve You may shower in 48 hours.  No baths, hot tub or pool for 7 days You may have mild burning when you urinate and may see a small amount of blood in your urine If needed can take Azo for burning for urinary discomfort which can be obtained over-the-counter You will be contacted for a follow-up appointment in approximately 1 month   AMBULATORY SURGERY  DISCHARGE INSTRUCTIONS   The drugs that you were given will stay in your system until tomorrow so for the next 24 hours you should not:  Drive an automobile Make any legal decisions Drink any alcoholic beverage   You may resume regular meals tomorrow.  Today it is better to start with liquids and gradually work up to solid foods.  You may eat anything you prefer, but it is better to start with liquids, then soup and crackers, and gradually work up to solid foods.   Please notify your doctor immediately if you have any unusual bleeding, trouble breathing, redness and pain at the surgery site, drainage, fever, or pain not relieved by medication.    Your post-operative visit with Dr.                                       is: Date:                        Time:    Please call to schedule your post-operative visit.  Additional Instructions:

## 2021-03-14 NOTE — Op Note (Signed)
Preoperative diagnosis:  Microhematuria Scrotal skin lesion  Postoperative diagnosis:  Same  Procedure: Cystoscopy Excision scrotal skin lesion  Surgeon: Abbie Sons, MD  Anesthesia: MAC/local  Complications: None  Intraoperative findings:  Cystoscopy: Urethra normal in caliber without strictures; prostate with hypervascularity/friability, mild lateral lobe enlargement.  Bladder mucosa normal in appearance without erythema, solid or papillary lesions.  UOs normal-appearing bilaterally. 2.  Pedunculated lesion junction right posterior scrotum/perineum measuring 0.5 x 3 x 3 cm.  ~ 5 mm stalk.  EBL: Minimal  Specimens:  Scrotal skin lesion  Indication: Dale Sawyer is a 55 y.o. patient with a large skin lesion right posterior scrotum who desires excision secondary to large size.  He was incidentally noted to have asymptomatic microhematuria at 3-10 RBCs.  Renal ultrasound ordered and has not yet been scheduled.  Flexible cystoscopy recommended at time of skin lesion excision.  After reviewing the management options for treatment, he elected to proceed with the above surgical procedure(s). We have discussed the potential benefits and risks of the procedure, side effects of the proposed treatment, the likelihood of the patient achieving the goals of the procedure, and any potential problems that might occur during the procedure or recuperation. Informed consent has been obtained.  Description of procedure:  The patient was taken to the operating room and placed in the dorsal lithotomy position.  Sedation was obtained by anesthesia.  The external genitalia and perineum were prepped and draped in the usual sterile fashion, and preoperative antibiotics were administered. A preoperative time-out was performed.   A flexible cystoscope was lubricated and passed per urethra and advanced proximally under direct vision with findings as described above.  The cystoscope was removed and  attention was directed to the right posterior scrotal/perineal area.  The skin stalk was anesthetized with 1% plain Xylocaine.  An elliptical incision was made encompassing the stalk and the lesion was sharply excised with iris scissors.  Hemostasis was obtained with electrocautery.  The skin was closed with interrupted 3-0 chromic suture.  Dermabond was applied to the site.  He was transported to the PACU in stable condition.  There were no complications.   Abbie Sons, M.D.

## 2021-03-14 NOTE — Interval H&P Note (Signed)
History and Physical Interval Note:  03/14/2021 11:22 AM  Dale Sawyer  has presented today for surgery, with the diagnosis of Scrotum Exploration with a scope.  The various methods of treatment have been discussed with the patient and family. After consideration of risks, benefits and other options for treatment, the patient has consented to  Procedure(s): SCROTUM EXPLORATION (N/A) FLEXIBLE CYSTOSCOPY (N/A) as a surgical intervention.  The patient's history has been reviewed, patient examined, no change in status, stable for surgery.  I have reviewed the patient's chart and labs.  Questions were answered to the patient's satisfaction.     Ferris Tally C Shalla Bulluck

## 2021-03-14 NOTE — Anesthesia Postprocedure Evaluation (Signed)
Anesthesia Post Note  Patient: Dale Sawyer  Procedure(s) Performed: SCROTUM EXPLORATION FLEXIBLE CYSTOSCOPY  Patient location during evaluation: PACU Anesthesia Type: General Level of consciousness: awake and alert Pain management: pain level controlled Vital Signs Assessment: post-procedure vital signs reviewed and stable Respiratory status: spontaneous breathing, nonlabored ventilation, respiratory function stable and patient connected to nasal cannula oxygen Cardiovascular status: blood pressure returned to baseline and stable Postop Assessment: no apparent nausea or vomiting Anesthetic complications: no   No notable events documented.   Last Vitals:  Vitals:   03/14/21 1315 03/14/21 1319  BP: (!) 135/93 (!) 142/103  Pulse: 81 80  Resp: 18 17  Temp: (!) 36.1 C (!) 36.2 C  SpO2: 100% 100%    Last Pain:  Vitals:   03/14/21 1319  TempSrc: Temporal  PainSc: 0-No pain                 Lenard Simmer

## 2021-03-15 LAB — SURGICAL PATHOLOGY

## 2021-04-14 ENCOUNTER — Ambulatory Visit: Payer: PRIVATE HEALTH INSURANCE | Admitting: Urology

## 2021-04-14 ENCOUNTER — Encounter: Payer: Self-pay | Admitting: Urology

## 2021-07-26 ENCOUNTER — Emergency Department
Admission: EM | Admit: 2021-07-26 | Discharge: 2021-07-26 | Disposition: A | Discharge status: Home / Self Care | Payer: BC Managed Care – PPO | Attending: Emergency Medicine | Admitting: Emergency Medicine

## 2021-07-26 ENCOUNTER — Other Ambulatory Visit: Payer: Self-pay

## 2021-07-26 ENCOUNTER — Encounter: Payer: Self-pay | Admitting: Emergency Medicine

## 2021-07-26 DIAGNOSIS — R0602 Shortness of breath: Secondary | ICD-10-CM | POA: Insufficient documentation

## 2021-07-26 DIAGNOSIS — I10 Essential (primary) hypertension: Secondary | ICD-10-CM | POA: Diagnosis not present

## 2021-07-26 DIAGNOSIS — R61 Generalized hyperhidrosis: Secondary | ICD-10-CM | POA: Insufficient documentation

## 2021-07-26 DIAGNOSIS — R42 Dizziness and giddiness: Secondary | ICD-10-CM | POA: Diagnosis not present

## 2021-07-26 DIAGNOSIS — R110 Nausea: Secondary | ICD-10-CM | POA: Diagnosis not present

## 2021-07-26 DIAGNOSIS — R531 Weakness: Secondary | ICD-10-CM | POA: Insufficient documentation

## 2021-07-26 LAB — BASIC METABOLIC PANEL
Anion gap: 8 (ref 5–15)
BUN: 18 mg/dL (ref 6–20)
CO2: 27 mmol/L (ref 22–32)
Calcium: 8.8 mg/dL — ABNORMAL LOW (ref 8.9–10.3)
Chloride: 102 mmol/L (ref 98–111)
Creatinine, Ser: 1.34 mg/dL — ABNORMAL HIGH (ref 0.61–1.24)
GFR, Estimated: >60 mL/min (ref >60)
Glucose, Bld: 164 mg/dL — ABNORMAL HIGH (ref 70–99)
Potassium: 4.6 mmol/L (ref 3.5–5.1)
Sodium: 137 mmol/L (ref 135–145)

## 2021-07-26 LAB — CBC
HCT: 45.6 % (ref 39.0–52.0)
Hemoglobin: 14.6 g/dL (ref 13.0–17.0)
MCH: 26.8 pg (ref 26.0–34.0)
MCHC: 32 g/dL (ref 30.0–36.0)
MCV: 83.7 fL (ref 80.0–100.0)
Platelets: 218 10*3/uL (ref 150–400)
RBC: 5.45 MIL/uL (ref 4.22–5.81)
RDW: 13.2 % (ref 11.5–15.5)
WBC: 6.9 10*3/uL (ref 4.0–10.5)
nRBC: 0 % (ref 0.0–0.2)

## 2021-07-26 LAB — TROPONIN I (HIGH SENSITIVITY)
Troponin I (High Sensitivity): 5 ng/L (ref <18)
Troponin I (High Sensitivity): 5 ng/L (ref <18)

## 2021-07-26 NOTE — ED Provider Notes (Signed)
[facility] Provider Note    Event Date/Time   First MD Initiated Contact with Patient 07/26/21 [phone]     (approximate)   History   Weakness   HPI  Dale Sawyer is a 56 y.o. male  with pmh HTN, bipolar disorder, OSA presents with generalized weakness.  Patient was at work today sitting down when he had sudden onset of feeling generally unwell, describes feeling dizzy/lightheaded weak diaphoretic and nauseous.  He had some mild shortness of breath denies any chest pain.  Felt worse when he stood up.  He denies actually vomiting or syncope.  Denies abdominal pain.  He checked his blood pressure at work and it was about 90 systolic which is lower for him.  Was also told by coworkers that he was very pale so that he should come to the emergency department.  Patient feeling improved now but does still describe a feeling of generalized heaviness.  Denies any ongoing shortness of breath or lightheadedness.  He denies headache visual change focal numbness tingling or weakness.     Past Medical History:  Diagnosis Date   Bipolar disorder (HCC)    GERD (gastroesophageal reflux disease)    Hypertension    Sleep apnea     There are no problems to display for this patient.    Physical Exam  Triage Vital Signs: ED Triage Vitals  Enc Vitals Group     BP 07/26/21 0639 112/70     Pulse Rate 07/26/21 0639 81     Resp 07/26/21 0639 20     Temp 07/26/21 0639 98.7 F (37.1 C)     Temp Source 07/26/21 0639 Oral     SpO2 07/26/21 0639 98 %     Weight --      Height --      Head Circumference --      Peak Flow --      Pain Score 07/26/21 0636 0     Pain Loc --      Pain Edu? --      Excl. in [place]? --     Most recent vital signs: Vitals:   07/26/21 0639 07/26/21 0848  BP: 112/70 110/76  Pulse: 81 75  Resp: 20 18  Temp: 98.7 F (37.1 C) 98.2 F (36.8 C)  SpO2: 98% 95%     General: Awake, no distress.  CV:  Good peripheral perfusion.  No lower  extremity edema Resp:  Normal effort.  Lungs are clear Abd:  No distention.  Soft and nontender throughout Neuro:             Awake, Alert, Oriented x 3  Other:     ED Results / Procedures / Treatments  Labs (all labs ordered are listed, but only abnormal results are displayed) Labs Reviewed  BASIC METABOLIC PANEL - Abnormal; Notable for the following components:      Result Value   Glucose, Bld 164 (*)    Creatinine, Ser 1.34 (*)    Calcium 8.8 (*)    All other components within normal limits  CBC  TROPONIN I (HIGH SENSITIVITY)  TROPONIN I (HIGH SENSITIVITY)     EKG  EKG interpretation performed by myself: NSR, nml axis, nml intervals, LVH, no acute ischemic changes    RADIOLOGY    PROCEDURES:  Critical Care performed: No    MEDICATIONS ORDERED IN ED: Medications - No data to display   IMPRESSION / MDM / ASSESSMENT AND PLAN / ED COURSE  I reviewed the triage vital signs and the nursing notes.                              Differential diagnosis includes, but is not limited to, vasovagal event, orthostatic, symptomatic anemia, ACS  Patient is a 56 year old male presents with acute onset of generalized weakness and global heaviness.  This occurred around 6 AM today while he was sitting at work.  Apparently his blood pressure was low in the 90s systolic, usually runs around 093.  He has associated shortness of breath diaphoresis and nausea but no chest pain.  Patient feeling much improved at the time my evaluation however does still endorse some generalized heavy feeling.  His exam is rather unremarkable.  Vital signs are within normal limits.  I reviewed his EKG which has no ischemic changes.  Initial troponin is negative.  CBC and BMP also largely within normal limits.  Creatinine is near baseline.  Suspect vasovagal versus orthostatic event, but somewhat unclear precipitant.  He has no focal symptoms to suggest CVA.  Will want to rule out ACS by obtaining another  troponin given his symptoms started less than 3 hours ago.  Less likely given no chest pain but could have been an anginal equivalent.  Patient's troponin x2 are negative.  Overall my suspicion for serious pathology is low.  He is stable for discharge.  We discussed return precautions.  Clinical Course as of 07/26/21 0932  Wed Jul 26, 2021  0926 Troponin I (High Sensitivity): 5 [KM]    Clinical Course User Index [KM] Georga Hacking, MD     FINAL CLINICAL IMPRESSION(S) / ED DIAGNOSES   Final diagnoses:  Weakness     Rx / DC Orders   ED Discharge Orders     None        Note:  This document was prepared using Dragon voice recognition software and may include unintentional dictation errors.   Georga Hacking, MD 07/26/21 [phone]

## 2021-07-26 NOTE — Discharge Instructions (Signed)
Your blood work was all reassuring including your cardiac enzymes.  Exactly what the cause of your symptoms was.  It may be that you are somewhat dehydrated.  Please rest and make sure you are eating and drinking.  If you have any new or concerning symptoms to you, please return to the emergency department as needed.

## 2021-07-26 NOTE — ED Triage Notes (Signed)
Patient ambulatory to triage with steady gait, without difficulty or distress noted; pt reports while at work PTA had onset of nausea and weakness; denies any recent illness, denies hx of same; st "machine at work said blood pressure was 70"

## 2022-06-12 ENCOUNTER — Other Ambulatory Visit: Payer: Self-pay

## 2022-06-12 ENCOUNTER — Encounter: Payer: Self-pay | Admitting: *Deleted

## 2022-06-12 ENCOUNTER — Emergency Department: Payer: BC Managed Care – PPO

## 2022-06-12 ENCOUNTER — Emergency Department
Admission: EM | Admit: 2022-06-12 | Discharge: 2022-06-13 | Disposition: A | Discharge status: Home / Self Care | Payer: BC Managed Care – PPO | Attending: Emergency Medicine | Admitting: Emergency Medicine

## 2022-06-12 DIAGNOSIS — W010XXA Fall on same level from slipping, tripping and stumbling without subsequent striking against object, initial encounter: Secondary | ICD-10-CM | POA: Diagnosis not present

## 2022-06-12 DIAGNOSIS — S52122A Displaced fracture of head of left radius, initial encounter for closed fracture: Secondary | ICD-10-CM | POA: Insufficient documentation

## 2022-06-12 DIAGNOSIS — M25522 Pain in left elbow: Secondary | ICD-10-CM | POA: Diagnosis present

## 2022-06-12 MED ORDER — OXYCODONE-ACETAMINOPHEN 5-325 MG PO TABS
1.0000 | ORAL_TABLET | Freq: Once | ORAL | Status: AC
Start: 2022-06-12 — End: 2022-06-12
  Administered 2022-06-12: 1 via ORAL
  Filled 2022-06-12: qty 1

## 2022-06-12 NOTE — ED Provider Notes (Signed)
[facility] Provider Note  Patient Contact: 10:47 PM (approximate)   History   Fall   HPI  Dale Sawyer is a 57 y.o. male who presents the emergency department complaining of elbow pain.  Patient states that he tripped and fell.  Unsure how exactly he landed on his elbow.  No open wounds.  Patient is having pain to the elbow joint with inability to extend or rotate the forearm.  No history of previous injury.  No medications prior to arrival.  No other complaints.     Physical Exam   Triage Vital Signs: ED Triage Vitals  Enc Vitals Group     BP 06/12/22 1845 (!) 164/102     Pulse Rate 06/12/22 1845 88     Resp 06/12/22 1845 20     Temp 06/12/22 1845 98.4 F (36.9 C)     Temp Source 06/12/22 1845 Oral     SpO2 06/12/22 1845 95 %     Weight 06/12/22 1843 270 lb (122.5 kg)     Height 06/12/22 1843 5\' 7"  (1.702 m)     Head Circumference --      Peak Flow --      Pain Score 06/12/22 1843 9     Pain Loc --      Pain Edu? --      Excl. in [place]? --     Most recent vital signs: Vitals:   06/12/22 1845 06/12/22 2228  BP: (!) 164/102 (!) 179/108  Pulse: 88 73  Resp: 20 20  Temp: 98.4 F (36.9 C)   SpO2: 95% 96%     General: Alert and in no acute distress. Head: No acute traumatic findings  Neck: No stridor. No cervical spine tenderness to palpation.  Cardiovascular:  Good peripheral perfusion Respiratory: Normal respiratory effort without tachypnea or retractions. Lungs CTAB. Musculoskeletal: Full range of motion to all extremities.  Patient currently with elbow bent in flexion.  He is unable to extend or pronate or supinate the forearm.  Significant tenderness along both the radial head and olecranon without palpable abnormality.  Does appear to be edematous when compared with unaffected side.  Pulse and sensation intact distally. Neurologic:  No gross focal neurologic deficits are appreciated.  Skin:   No rash noted Other:   ED Results  / Procedures / Treatments   Labs (all labs ordered are listed, but only abnormal results are displayed) Labs Reviewed - No data to display   EKG     RADIOLOGY  I personally viewed, evaluated, and interpreted these images as part of my medical decision making, as well as reviewing the written report by the radiologist.  ED Provider Interpretation: Comminuted angulated radial head fracture  DG Elbow Complete Left  Result Date: 06/12/2022 CLINICAL DATA:  Fall.  Unable to straighten elbow. EXAM: LEFT ELBOW - COMPLETE 4 VIEW COMPARISON:  None Available. FINDINGS: A comminuted intra-articular fracture is present at the radial head with slight angulation. Large effusion is present. Minimal calcifications extending from the epicondyles bilaterally likely reflect calcific tendonitis. No additional fractures are present. IMPRESSION: 1. Comminuted intra-articular fracture at the radial head with slight angulation. 2. Large effusion. Electronically Signed   By: San Morelle M.D.   On: 06/12/2022 19:18    PROCEDURES:  Critical Care performed: No  .Splint Application  Date/Time: 06/13/2022 12:03 AM  Performed by: Darletta Moll, PA-C Authorized by: Darletta Moll, PA-C   Consent:    Consent obtained:  Verbal  Consent given by:  Patient   Risks discussed:  Pain and swelling Universal protocol:    Procedure explained and questions answered to patient or proxy's satisfaction: yes     Immediately prior to procedure a time out was called: yes     Patient identity confirmed:  Verbally with patient Pre-procedure details:    Distal neurologic exam:  Normal   Distal perfusion: distal pulses strong and brisk capillary refill   Procedure details:    Location:  Elbow   Elbow location:  L elbow   Splint type:  Long arm   Supplies:  Cotton padding, elastic bandage, fiberglass and sling   Attestation: Splint applied and adjusted personally by me   Post-procedure details:     Distal neurologic exam:  Normal   Distal perfusion: distal pulses strong and brisk capillary refill     Procedure completion:  Tolerated well, no immediate complications   Post-procedure imaging: not applicable      MEDICATIONS ORDERED IN ED: Medications  oxyCODONE-acetaminophen (PERCOCET/ROXICET) 5-325 MG per tablet 1 tablet (1 tablet Oral Given 06/12/22 2329)     IMPRESSION / MDM / [place] / ED COURSE  I reviewed the triage vital signs and the nursing notes.                                 Differential diagnosis includes, but is not limited to, elbow contusion, elbow sprain, forearm fracture, elbow fracture  Patient's presentation is most consistent with acute presentation with potential threat to life or bodily function.   Patient's diagnosis is consistent with radial head fracture.  Patient presents the emergency department after mechanical fall.  He tripped over a hose and landed on his left arm.  Patient is unable to fully extend his arm or supinate or pronate his forearm.  Pulses sensation intact on arrival.  Patient has imaging which confirms comminuted slightly angulated fracture of the radial head.  Splint applied.  Patient with prescription for pain medication.  Discussed the patient with on-call orthopedic surgeon, Dr. Posey Pronto.  Patient will follow-up outpatient with Dr. Posey Pronto within the next few days..  Patient is given ED precautions to return to the ED for any worsening or new symptoms.     FINAL CLINICAL IMPRESSION(S) / ED DIAGNOSES   Final diagnoses:  Closed displaced fracture of head of left radius, initial encounter     Rx / DC Orders   ED Discharge Orders          Ordered    oxyCODONE-acetaminophen (PERCOCET/ROXICET) 5-325 MG tablet  Every 6 hours PRN        06/13/22 0013             Note:  This document was prepared using Dragon voice recognition software and may include unintentional dictation errors.   Darletta Moll,  PA-C 06/13/22 0028    Naaman Plummer, MD 06/13/22 [phone]

## 2022-06-12 NOTE — ED Provider Notes (Incomplete)
[facility] Provider Note  Patient Contact: 10:47 PM (approximate)   History   Fall   HPI  Dale Sawyer is a 57 y.o. male who presents the emergency department complaining of elbow pain.  Patient states that he tripped and fell.  Unsure how exactly he landed on his elbow.  No open wounds.  Patient is having pain to the elbow joint with inability to extend or rotate the forearm.  No history of previous injury.  No medications prior to arrival.  No other complaints.     Physical Exam   Triage Vital Signs: ED Triage Vitals  Enc Vitals Group     BP 06/12/22 1845 (!) 164/102     Pulse Rate 06/12/22 1845 88     Resp 06/12/22 1845 20     Temp 06/12/22 1845 98.4 F (36.9 C)     Temp Source 06/12/22 1845 Oral     SpO2 06/12/22 1845 95 %     Weight 06/12/22 1843 270 lb (122.5 kg)     Height 06/12/22 1843 5\' 7"  (1.702 m)     Head Circumference --      Peak Flow --      Pain Score 06/12/22 1843 9     Pain Loc --      Pain Edu? --      Excl. in [place]? --     Most recent vital signs: Vitals:   06/12/22 1845 06/12/22 2228  BP: (!) 164/102 (!) 179/108  Pulse: 88 73  Resp: 20 20  Temp: 98.4 F (36.9 C)   SpO2: 95% 96%     General: Alert and in no acute distress. Head: No acute traumatic findings  Neck: No stridor. No cervical spine tenderness to palpation.  Cardiovascular:  Good peripheral perfusion Respiratory: Normal respiratory effort without tachypnea or retractions. Lungs CTAB. Musculoskeletal: Full range of motion to all extremities.  Patient currently with elbow bent in flexion.  He is unable to extend or pronate or supinate the forearm.  Significant tenderness along both the radial head and olecranon without palpable abnormality.  Does appear to be edematous when compared with unaffected side.  Pulse and sensation intact distally. Neurologic:  No gross focal neurologic deficits are appreciated.  Skin:   No rash noted Other:   ED Results  / Procedures / Treatments   Labs (all labs ordered are listed, but only abnormal results are displayed) Labs Reviewed - No data to display   EKG     RADIOLOGY  I personally viewed, evaluated, and interpreted these images as part of my medical decision making, as well as reviewing the written report by the radiologist.  ED Provider Interpretation: Comminuted angulated radial head fracture  DG Elbow Complete Left  Result Date: 06/12/2022 CLINICAL DATA:  Fall.  Unable to straighten elbow. EXAM: LEFT ELBOW - COMPLETE 4 VIEW COMPARISON:  None Available. FINDINGS: A comminuted intra-articular fracture is present at the radial head with slight angulation. Large effusion is present. Minimal calcifications extending from the epicondyles bilaterally likely reflect calcific tendonitis. No additional fractures are present. IMPRESSION: 1. Comminuted intra-articular fracture at the radial head with slight angulation. 2. Large effusion. Electronically Signed   By: San Morelle M.D.   On: 06/12/2022 19:18    PROCEDURES:  Critical Care performed: No  .Splint Application  Date/Time: 06/13/2022 12:03 AM  Performed by: Darletta Moll, PA-C Authorized by: Darletta Moll, PA-C   Consent:    Consent obtained:  Verbal  Consent given by:  Patient   Risks discussed:  Pain and swelling Universal protocol:    Procedure explained and questions answered to patient or proxy's satisfaction: yes     Immediately prior to procedure a time out was called: yes     Patient identity confirmed:  Verbally with patient Pre-procedure details:    Distal neurologic exam:  Normal   Distal perfusion: distal pulses strong and brisk capillary refill   Procedure details:    Location:  Elbow   Elbow location:  L elbow   Splint type:  Long arm   Supplies:  Cotton padding, elastic bandage, fiberglass and sling   Attestation: Splint applied and adjusted personally by me   Post-procedure details:     Distal neurologic exam:  Normal   Distal perfusion: distal pulses strong and brisk capillary refill     Procedure completion:  Tolerated well, no immediate complications   Post-procedure imaging: not applicable      MEDICATIONS ORDERED IN ED: Medications  oxyCODONE-acetaminophen (PERCOCET/ROXICET) 5-325 MG per tablet 1 tablet (1 tablet Oral Given 06/12/22 2329)     IMPRESSION / MDM / ASSESSMENT AND PLAN / ED COURSE  I reviewed the triage vital signs and the nursing notes.                                 Differential diagnosis includes, but is not limited to, elbow contusion, elbow sprain, forearm fracture, elbow fracture  Patient's presentation is most consistent with acute presentation with potential threat to life or bodily function.   Patient's diagnosis is consistent with radial head fracture.  Patient presents the emergency department after mechanical fall.  He tripped over a hose and landed on his left arm.  Patient is unable to fully extend his arm or supinate or pronate his forearm.  Pulses sensation intact on arrival.  Patient has imaging which confirms comminuted slightly angulated fracture of the radial head.  Splint applied.  Patient with prescription for pain medication.  Discussed the patient with on-call orthopedic surgeon, Dr. Allena Katz.. Patient will be discharged home with prescriptions for ***. Patient is to follow up with *** as needed or otherwise directed. Patient is given ED precautions to return to the ED for any worsening or new symptoms.     FINAL CLINICAL IMPRESSION(S) / ED DIAGNOSES   Final diagnoses:  None     Rx / DC Orders   ED Discharge Orders     None        Note:  This document was prepared using Dragon voice recognition software and may include unintentional dictation errors.

## 2022-06-12 NOTE — ED Triage Notes (Signed)
Pt fell outside today, tripped over the hose and fell.  Pt has left elbow pain.  Denies other injury.  Pt alert

## 2022-06-12 NOTE — ED Provider Triage Note (Signed)
Emergency Medicine Provider Triage Evaluation Note  KEYON LILLER , a 57 y.o. male  was evaluated in triage.  Pt complains of left elbow pain. Patient states his foot got tangled in a water hose in the driveway and he fell, landing on his left elbow. Pain increases with rotation of his wrist.  Physical Exam  BP (!) 164/102 (BP Location: Left Arm)   Pulse 88   Temp 98.4 F (36.9 C) (Oral)   Resp 20   Ht 5\' 7"  (1.702 m)   Wt 122.5 kg   SpO2 95%   BMI 42.29 kg/m  Gen:   Awake, no distress   Resp:  Normal effort  MSK:   Moves extremities without difficulty  Other:    Medical Decision Making  Medically screening exam initiated at 7:10 PM.  Appropriate orders placed.  SHYKEEM RESURRECCION was informed that the remainder of the evaluation will be completed by another provider, this initial triage assessment does not replace that evaluation, and the importance of remaining in the ED until their evaluation is complete.    Victorino Dike, FNP 06/12/22 1912

## 2022-06-13 MED ORDER — OXYCODONE-ACETAMINOPHEN 5-325 MG PO TABS: 1.0000 | ORAL_TABLET | Freq: Four times a day (QID) | ORAL | 0 refills | Status: AC | PRN

## 2022-06-13 NOTE — ED Notes (Signed)
E-signature pad unavailable - Pt verbalized understanding of D/C information - no additional concerns at this time.  

## 2022-06-22 IMAGING — CR DG CHEST 2V
2 series · 2 of 2 positions shown · non-contrast
Comparison: 03/01/2017

CLINICAL DATA: Shortness of breath

EXAM:
CHEST - 2 VIEW

[chest pa]
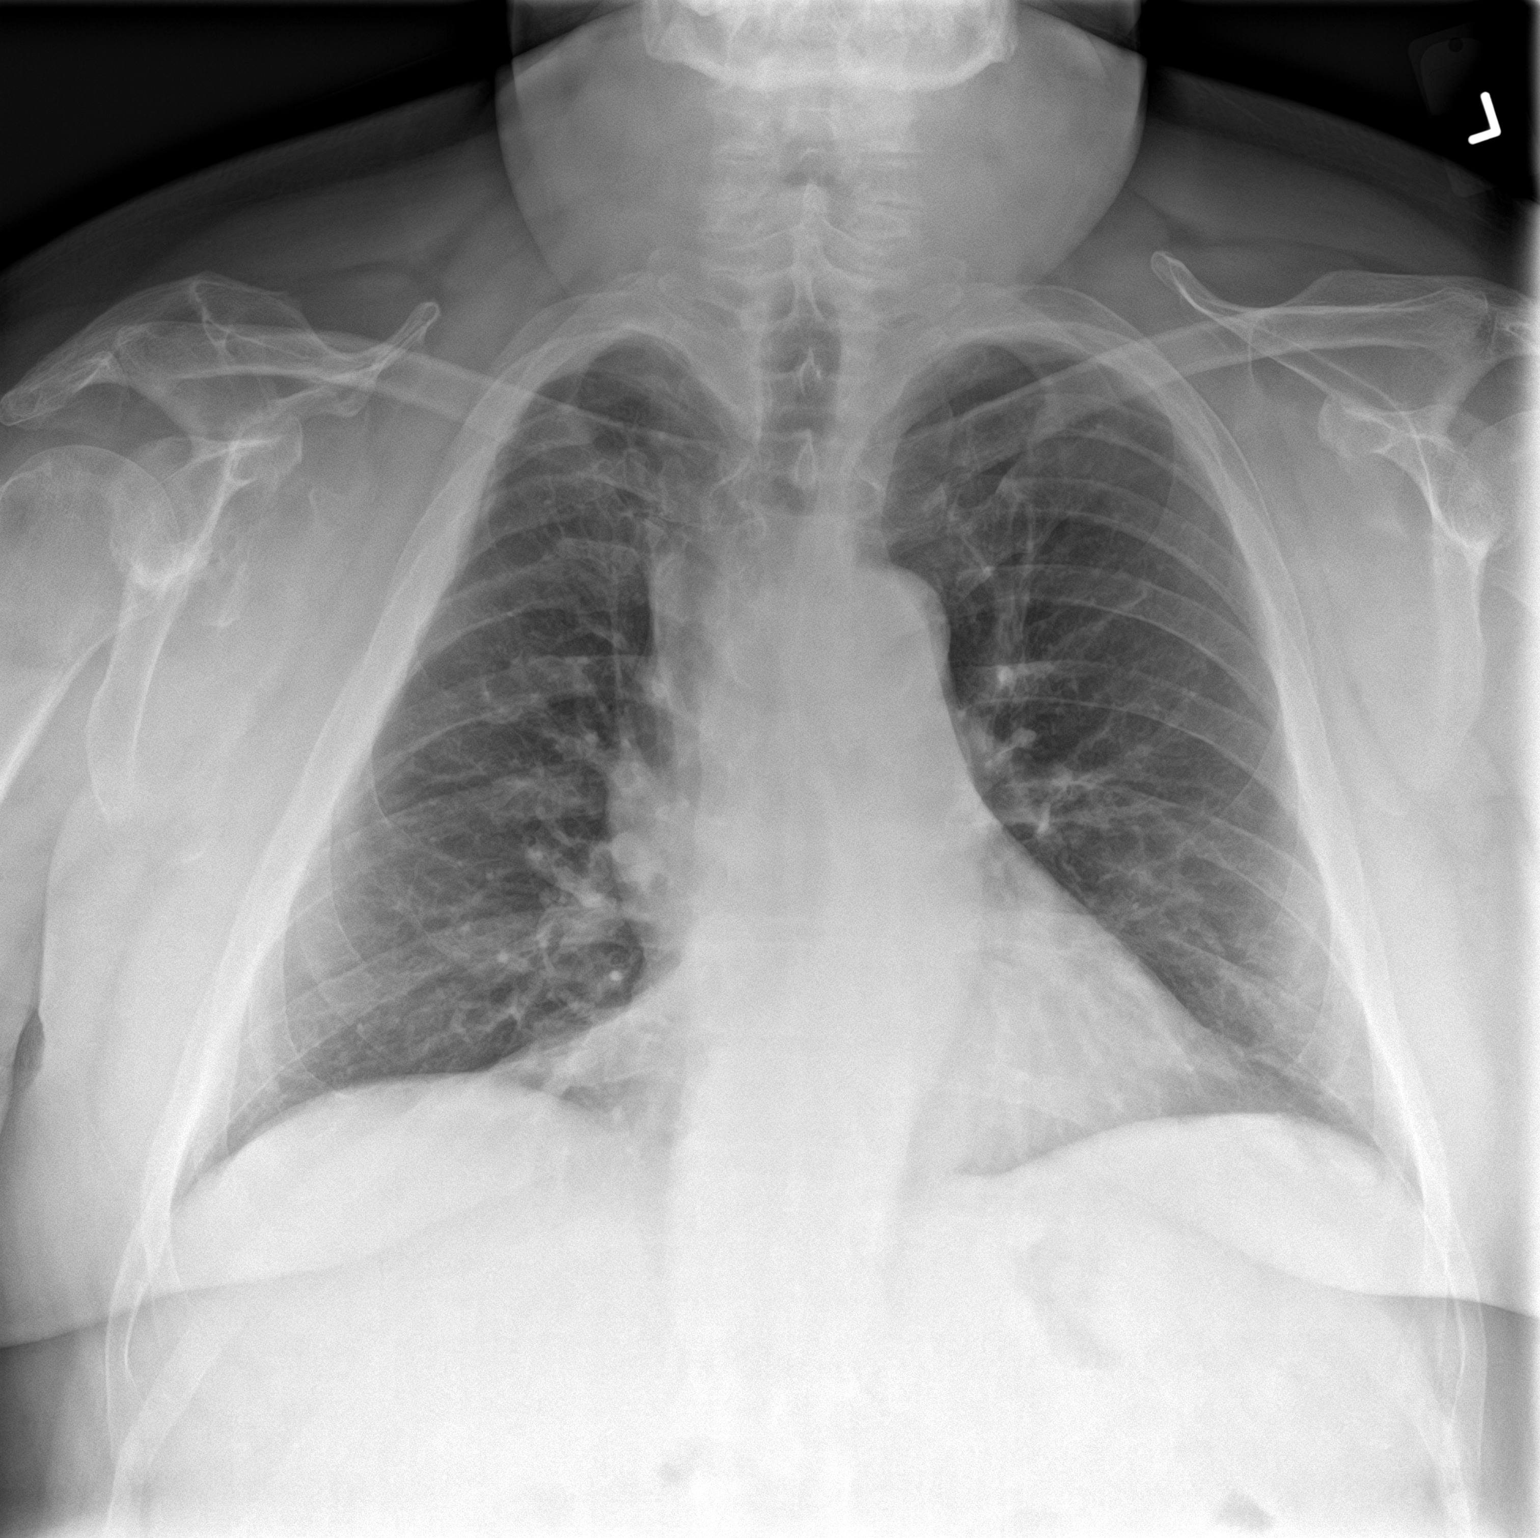

[chest lat]
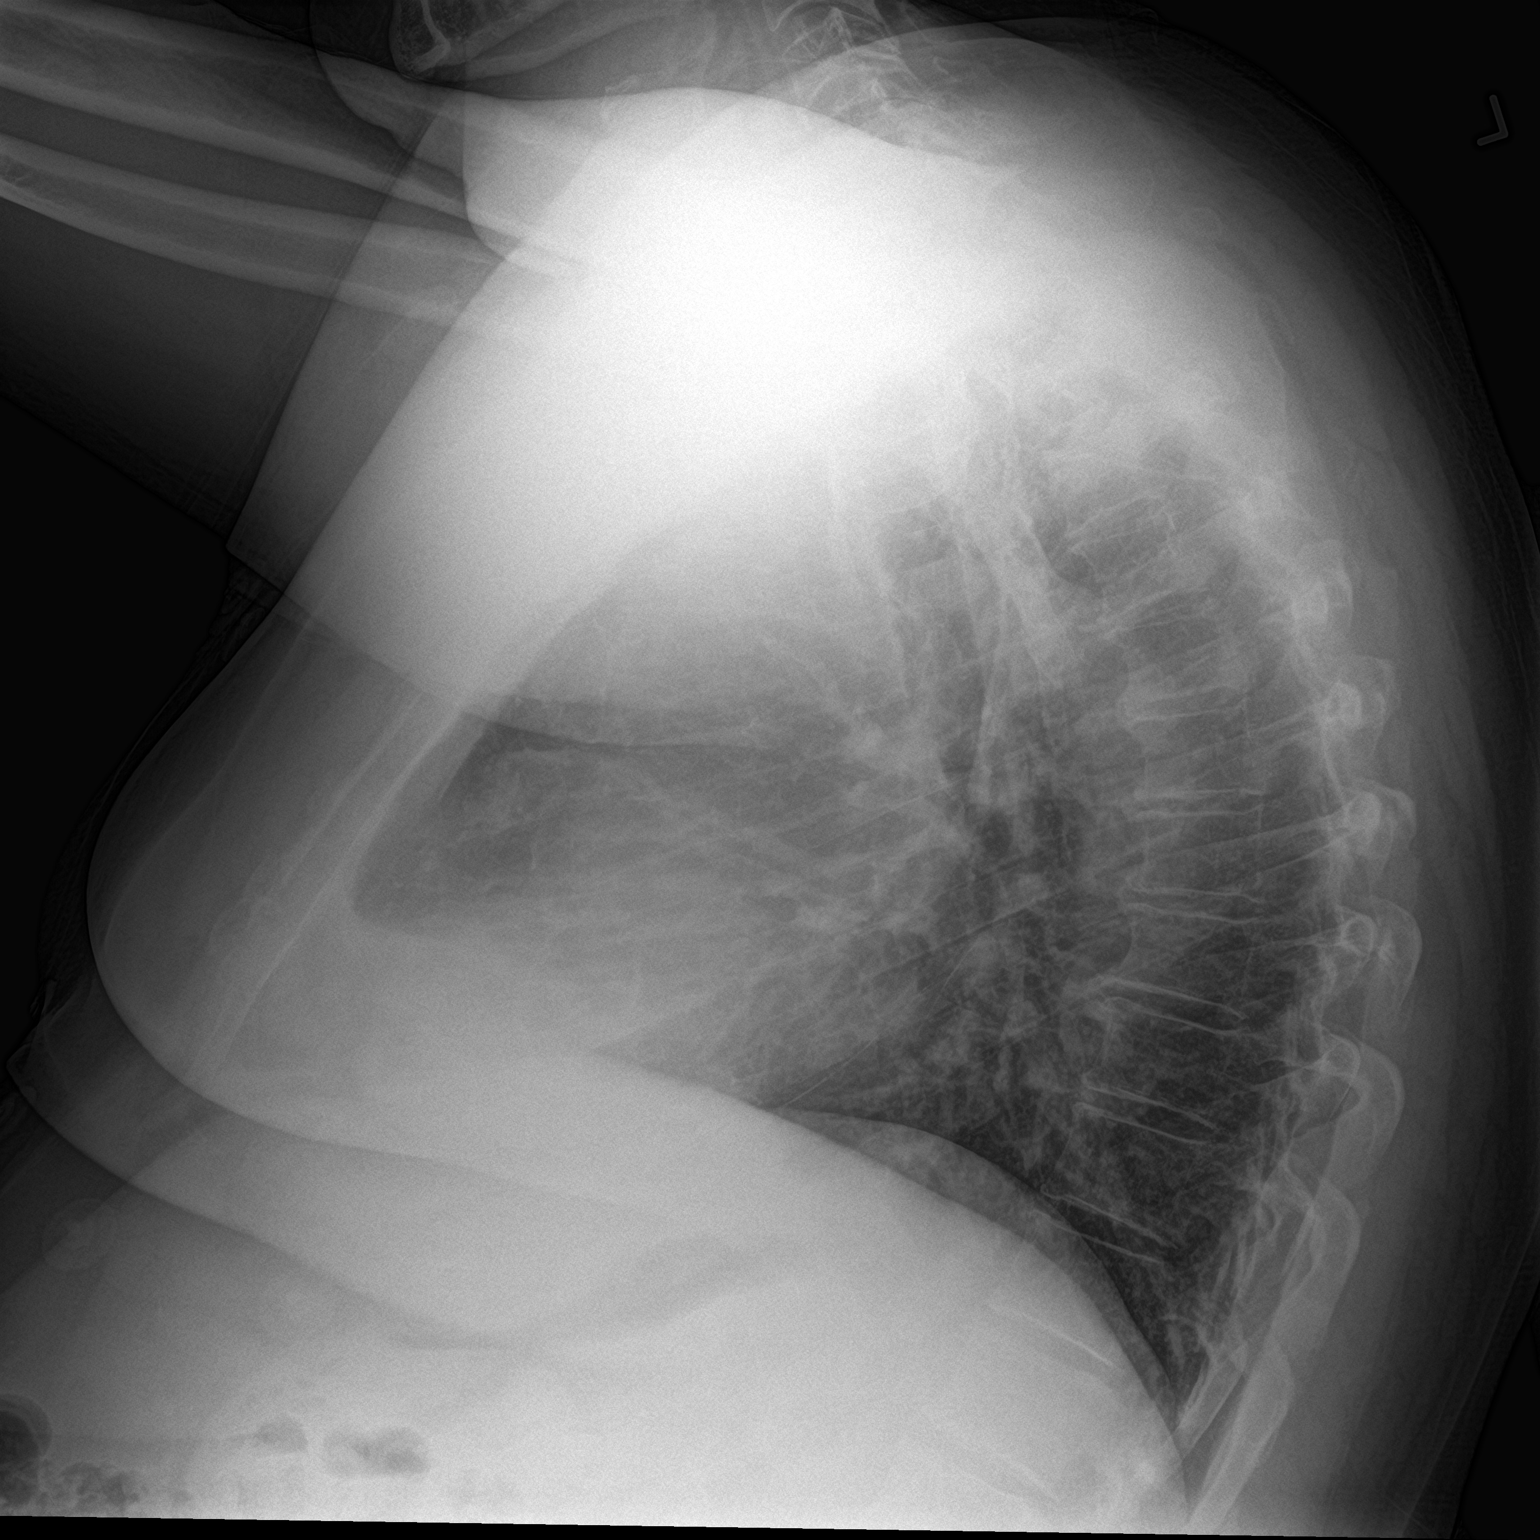

[2 of 2 positions shown; findings below may reference images not displayed]

FINDINGS: Heart is normal size. No confluent opacities or effusions. No acute
bony abnormality.
IMPRESSION: No active cardiopulmonary disease.

## 2023-05-19 ENCOUNTER — Emergency Department
Admission: EM | Admit: 2023-05-19 | Discharge: 2023-05-19 | Disposition: A | Discharge status: Home / Self Care | Payer: BC Managed Care – PPO | Attending: Student in an Organized Health Care Education/Training Program | Admitting: Student in an Organized Health Care Education/Training Program

## 2023-05-19 ENCOUNTER — Other Ambulatory Visit: Payer: Self-pay

## 2023-05-19 ENCOUNTER — Emergency Department: Payer: BC Managed Care – PPO

## 2023-05-19 DIAGNOSIS — R0789 Other chest pain: Secondary | ICD-10-CM

## 2023-05-19 LAB — TROPONIN I (HIGH SENSITIVITY)
Troponin I (High Sensitivity): 7 ng/L (ref <18)
Troponin I (High Sensitivity): 5 ng/L (ref <18)

## 2023-05-19 LAB — CBC
HCT: 43.7 % (ref 39.0–52.0)
Hemoglobin: 14.6 g/dL (ref 13.0–17.0)
MCH: 27.2 pg (ref 26.0–34.0)
MCHC: 33.4 g/dL (ref 30.0–36.0)
MCV: 81.5 fL (ref 80.0–100.0)
Platelets: 246 10*3/uL (ref 150–400)
RBC: 5.36 MIL/uL (ref 4.22–5.81)
RDW: 13.4 % (ref 11.5–15.5)
WBC: 9.5 10*3/uL (ref 4.0–10.5)
nRBC: 0 % (ref 0.0–0.2)

## 2023-05-19 LAB — BASIC METABOLIC PANEL
Anion gap: 7 (ref 5–15)
BUN: 16 mg/dL (ref 6–20)
CO2: 28 mmol/L (ref 22–32)
Calcium: 9 mg/dL (ref 8.9–10.3)
Chloride: 103 mmol/L (ref 98–111)
Creatinine, Ser: 1.11 mg/dL (ref 0.61–1.24)
GFR, Estimated: >60 mL/min (ref >60)
Glucose, Bld: 110 mg/dL — ABNORMAL HIGH (ref 70–99)
Potassium: 4.1 mmol/L (ref 3.5–5.1)
Sodium: 138 mmol/L (ref 135–145)

## 2023-05-19 NOTE — ED Provider Notes (Signed)
[facility] Provider Note    Event Date/Time   First MD Initiated Contact with Patient 05/19/23 [phone]     (approximate)   History   Chest Pain   HPI  Dale Sawyer is a 57 y.o. male with a history of bipolar disorder as well as hypertension and prediabetes presents to the ER for evaluation of palpitations and some chest discomfort.  No pleuritic chest pain.  Symptoms awoke him from sleep.  Last about an hour.  No associated diaphoresis.  No alleviating or aggravating factors.  No nausea or vomiting.  Denies any abdominal pain.  Has had the symptoms happen before.  Does endorse some stressors at home.  He does not smoke.     Physical Exam   Triage Vital Signs: ED Triage Vitals  Encounter Vitals Group     BP 05/19/23 0714 (!) 159/109     Systolic BP Percentile --      Diastolic BP Percentile --      Pulse Rate 05/19/23 0714 78     Resp 05/19/23 0714 20     Temp 05/19/23 0714 98 F (36.7 C)     Temp src --      SpO2 05/19/23 0714 95 %     Weight 05/19/23 0713 260 lb (117.9 kg)     Height 05/19/23 0713 5\' 7"  (1.702 m)     Head Circumference --      Peak Flow --      Pain Score 05/19/23 0713 2     Pain Loc --      Pain Education --      Exclude from Growth Chart --     Most recent vital signs: Vitals:   05/19/23 0714  BP: (!) 159/109  Pulse: 78  Resp: 20  Temp: 98 F (36.7 C)  SpO2: 95%     Constitutional: Alert  Eyes: Conjunctivae are normal.  Head: Atraumatic. Nose: No congestion/rhinnorhea. Mouth/Throat: Mucous membranes are moist.   Neck: Painless ROM.  Cardiovascular:   Good peripheral circulation. Respiratory: Normal respiratory effort.  No retractions.  Gastrointestinal: Soft and nontender.  Musculoskeletal:  no deformity Neurologic:  MAE spontaneously. No gross focal neurologic deficits are appreciated.  Skin:  Skin is warm, dry and intact. No rash noted. Psychiatric: Mood and affect are normal. Speech and behavior are  normal.    ED Results / Procedures / Treatments   Labs (all labs ordered are listed, but only abnormal results are displayed) Labs Reviewed  BASIC METABOLIC PANEL - Abnormal; Notable for the following components:      Result Value   Glucose, Bld 110 (*)    All other components within normal limits  CBC  TROPONIN I (HIGH SENSITIVITY)  TROPONIN I (HIGH SENSITIVITY)     EKG  ED ECG REPORT I, Willy Eddy, the attending physician, personally viewed and interpreted this ECG.   Date: 05/19/2023  EKG Time: 7:16  Rate: 80  Rhythm: sinus  Axis: normal  Intervals: normal intervals  ST&T Change: no stemi    RADIOLOGY Please see ED Course for my review and interpretation.  I personally reviewed all radiographic images ordered to evaluate for the above acute complaints and reviewed radiology reports and findings.  These findings were personally discussed with the patient.  Please see medical record for radiology report.    PROCEDURES:  Critical Care performed: No  Procedures   MEDICATIONS ORDERED IN ED: Medications - No data to display   IMPRESSION /  MDM / ASSESSMENT AND PLAN / ED COURSE  I reviewed the triage vital signs and the nursing notes.                              Differential diagnosis includes, but is not limited to, ACS, pericarditis, esophagitis, boerhaaves, pe, dissection, pna, bronchitis, costochondritis  Patient presenting to the ER for evaluation of symptoms as described above.  Based on symptoms, risk factors and considered above differential, this presenting complaint could reflect a potentially life-threatening illness therefore the patient will be placed on continuous pulse oximetry and telemetry for monitoring.  Laboratory evaluation will be sent to evaluate for the above complaints.  Patient lowers by heart score initial troponin negative.  EKG nonischemic.  Will observe for serial troponins.  Lowers by Anner Crete criteria not clinically consistent  with PE given brief nature of pain no significant shortness of breath no hypoxia no tachycardia no risk factors for PE or DVT.  Chest x-ray on my review and interpretation without evidence of consolidation or pneumothorax.  His abdominal exam is soft and benign.   Clinical Course as of 05/19/23 1017  Sun May 19, 2023  1016 Repeat troponin is negative.  Patient does appear stable and appropriate for outpatient follow-up. [PR]    Clinical Course User Index [PR] Willy Eddy, MD     FINAL CLINICAL IMPRESSION(S) / ED DIAGNOSES   Final diagnoses:  Atypical chest pain     Rx / DC Orders   ED Discharge Orders          Ordered    Ambulatory referral to Cardiology       Comments: If you have not heard from the Cardiology office within the next 72 hours please call [phone].   05/19/23 1016             Note:  This document was prepared using Dragon voice recognition software and may include unintentional dictation errors.    Willy Eddy, MD 05/19/23 1017

## 2023-05-19 NOTE — ED Triage Notes (Signed)
Pt comes with left sided cp. Pt states it started at 5am this morning. Pt states no radiation, N/V. Pt states bad headache when he got up

## 2023-07-30 ENCOUNTER — Telehealth: Payer: Self-pay | Admitting: *Deleted

## 2023-07-30 NOTE — Progress Notes (Deleted)
 NO SHOW

## 2023-07-30 NOTE — Telephone Encounter (Signed)
 LMOVM to verify card hx.

## 2023-08-02 ENCOUNTER — Ambulatory Visit: Payer: 59 | Attending: Cardiovascular Disease | Admitting: Cardiovascular Disease

## 2023-08-02 DIAGNOSIS — I10 Essential (primary) hypertension: Secondary | ICD-10-CM

## 2023-08-02 DIAGNOSIS — R079 Chest pain, unspecified: Secondary | ICD-10-CM

## 2023-08-02 DIAGNOSIS — F319 Bipolar disorder, unspecified: Secondary | ICD-10-CM

## 2024-01-17 ENCOUNTER — Ambulatory Visit: Admission: EM | Admit: 2024-01-17 | Discharge: 2024-01-17 | Disposition: A | Discharge status: Home / Self Care

## 2024-01-17 DIAGNOSIS — M545 Low back pain, unspecified: Secondary | ICD-10-CM | POA: Diagnosis not present

## 2024-01-17 DIAGNOSIS — M62830 Muscle spasm of back: Secondary | ICD-10-CM

## 2024-01-17 MED ORDER — TIZANIDINE HCL 4 MG PO TABS: 4.0000 mg | ORAL_TABLET | Freq: Every evening | ORAL | 0 refills | Status: AC | PRN

## 2024-01-17 MED ORDER — NAPROXEN 500 MG PO TABS
500.0000 mg | ORAL_TABLET | Freq: Two times a day (BID) | ORAL | 0 refills | Status: AC
Start: 2024-01-17 — End: ?

## 2024-01-17 NOTE — ED Provider Notes (Signed)
 [facility]    CSN: [id number] Arrival date & time: 01/17/24  9077      History   Chief Complaint Chief Complaint  Patient presents with   Back Pain    HPI Dale Sawyer is a 58 y.o. male presenting for atraumatic 4 day history of sharp, intermittent right sided lower back pain. No radiation of pain to lower extremities, but sometimes it feels like the pain radiates up his back. He also occasionally feels left lower back pain. No numbness, weakness, tingling. Pain is worse with  movement especially extension of his back and rotation to the left. No dysuria, hematuria, urinary frequency or history of kidney stones. Has taken OTC meds without relief.  HPI  Past Medical History:  Diagnosis Date   Bipolar disorder (HCC)    GERD (gastroesophageal reflux disease)    Hypertension    Sleep apnea     There are no active problems to display for this patient.   Past Surgical History:  Procedure Laterality Date   CYSTOSCOPY N/A 03/14/2021   Procedure: FLEXIBLE CYSTOSCOPY;  Surgeon: Twylla Glendia BROCKS, MD;  Location: [facility] ORS;  Service: Urology;  Laterality: N/A;   KNEE ARTHROSCOPY Right    SCROTAL EXPLORATION N/A 03/14/2021   Procedure: SCROTUM EXPLORATION;  Surgeon: Twylla Glendia BROCKS, MD;  Location: [facility] ORS;  Service: Urology;  Laterality: N/A;   WOUND DEBRIDEMENT         Home Medications    Prior to Admission medications   Medication Sig Start Date End Date Taking? Authorizing Provider  carvedilol (COREG) 12.5 MG tablet Take 12.5 mg by mouth. 02/13/23  Yes [provider]  lamoTRIgine (LAMICTAL) 200 MG tablet Take 200 mg by mouth daily.   Yes [provider]  losartan-hydrochlorothiazide (HYZAAR) 100-12.5 MG tablet Take 1 tablet by mouth daily. 01/24/21  Yes [provider]  pantoprazole (PROTONIX) 40 MG tablet Take 40 mg by mouth daily. 02/18/21  Yes [provider]  aspirin 81 MG tablet Take 1 tablet (81 mg total) by mouth  daily. 03/01/17   Edelmiro Leash, MD  HYDROcodone-acetaminophen (NORCO/VICODIN) 5-325 MG tablet Take 1 tablet by mouth every 6 (six) hours as needed for moderate pain. 03/14/21   Stoioff, Scott C, MD  oxyCODONE-acetaminophen (PERCOCET/ROXICET) 5-325 MG tablet Take 1 tablet by mouth every 6 (six) hours as needed for severe pain. 06/13/22   Cuthriell, Dorn BIRCH, PA-C    Family History Family History  Adopted: Yes  Family history unknown: Yes    Social History Social History   Tobacco Use   Smoking status: Never   Smokeless tobacco: Never  Vaping Use   Vaping status: Never Used  Substance Use Topics   Alcohol use: Not Currently    Comment: casual   Drug use: Never     Allergies   Lisinopril and Penicillins   Review of Systems Review of Systems  Genitourinary:  Negative for difficulty urinating, flank pain and hematuria.  Musculoskeletal:  Positive for back pain. Negative for arthralgias and gait problem.  Skin:  Negative for rash.  Neurological:  Negative for weakness and numbness.     Physical Exam Triage Vital Signs ED Triage Vitals  Encounter Vitals Group     BP 01/17/24 0938 (!) 156/80     Girls Systolic BP Percentile --      Girls Diastolic BP Percentile --      Boys Systolic BP Percentile --      Boys Diastolic BP Percentile --  Pulse Rate 01/17/24 0938 60     Resp 01/17/24 0938 17     Temp 01/17/24 0938 99 F (37.2 C)     Temp Source 01/17/24 0938 Oral     SpO2 01/17/24 0938 96 %     Weight 01/17/24 0935 265 lb (120.2 kg)     Height --      Head Circumference --      Peak Flow --      Pain Score 01/17/24 0937 7     Pain Loc --      Pain Education --      Exclude from Growth Chart --    No data found.  Updated Vital Signs BP (!) 156/80 (BP Location: Right Arm)   Pulse 60   Temp 99 F (37.2 C) (Oral)   Resp 17   Wt 265 lb (120.2 kg)   SpO2 96%   BMI 41.50 kg/m      Physical Exam Vitals and nursing note reviewed.  Constitutional:       General: He is not in acute distress.    Appearance: Normal appearance. He is well-developed. He is obese. He is not ill-appearing.  HENT:     Head: Normocephalic and atraumatic.  Eyes:     Conjunctiva/sclera: Conjunctivae normal.  Cardiovascular:     Rate and Rhythm: Normal rate.  Pulmonary:     Effort: Pulmonary effort is normal. No respiratory distress.  Abdominal:     Palpations: Abdomen is soft.     Tenderness: There is no abdominal tenderness. There is no right CVA tenderness or left CVA tenderness.  Musculoskeletal:     Cervical back: Neck supple.     Lumbar back: Tenderness present. No bony tenderness. Normal range of motion. Negative right straight leg raise test and negative left straight leg raise test.       Back:     Comments: TTP right lumbar muscles  Skin:    General: Skin is warm and dry.     Capillary Refill: Capillary refill takes less than 2 seconds.  Neurological:     General: No focal deficit present.     Mental Status: He is alert. Mental status is at baseline.     Motor: No weakness.     Gait: Gait normal.  Psychiatric:        Mood and Affect: Mood normal.        Behavior: Behavior normal.      UC Treatments / Results  Labs (all labs ordered are listed, but only abnormal results are displayed) Labs Reviewed - No data to display tains abnormal data Comprehensive Metabolic Panel (CMP) Order: [id number] Component Ref Range & Units 10 d ago  Glucose 70 - 110 mg/dL 894  Sodium 863 - 854 mmol/L 139  Potassium 3.6 - 5.1 mmol/L 4.6  Chloride 97 - 109 mmol/L 102  Carbon Dioxide (CO2) 22.0 - 32.0 mmol/L 32.3 High   Urea Nitrogen (BUN) 7 - 25 mg/dL 16  Creatinine 0.7 - 1.3 mg/dL 1.2  Glomerular Filtration Rate (eGFR) >60 mL/min/1.73sq m 71  Comment: CKD-EPI (2021) does not include patient's race in the calculation of eGFR.  Monitoring changes of plasma creatinine and eGFR over time is useful for monitoring kidney function.  Interpretive  Ranges for eGFR (CKD-EPI 2021):  eGFR:       >60 mL/min/1.73 sq. m - Normal eGFR:       30-59 mL/min/1.73 sq. m - Moderately Decreased eGFR:  15-29 mL/min/1.73 sq. m  - Severely Decreased eGFR:       < 15 mL/min/1.73 sq. m  - Kidney Failure   Note: These eGFR calculations do not apply in acute situations when eGFR is changing rapidly or patients on dialysis.  Calcium 8.7 - 10.3 mg/dL 9.5  AST 8 - 39 U/L 15  ALT 6 - 57 U/L 23  Alk Phos (alkaline Phosphatase) 34 - 104 U/L 52  Albumin 3.5 - 4.8 g/dL 4.5  Bilirubin, Total 0.3 - 1.2 mg/dL 1.3 High   Protein, Total 6.1 - 7.9 g/dL 7.5  A/G Ratio 1.0 - 5.0 gm/dL 1.5  Resulting Agency [facility]   EKG   Radiology No results found.  Procedures Procedures (including critical care time)  Medications Ordered in UC Medications - No data to display  Initial Impression / Assessment and Plan / UC Course  I have reviewed the triage vital signs and the nursing notes.  Pertinent labs & imaging results that were available during my care of the patient were reviewed by me and considered in my medical decision making (see chart for details).   58 y/o male presents for atraumatic right lower back pain x 4 days.  No recent change to physical routine.  No radiation of pain to lower extremities, numbness/tingling or weakness.  History of intermittent back pain every month or so but says pain typically does not last as long.  No relief with OTC meds.  On evaluation he has no spinal tenderness and full range of motion of back.  He does have tenderness to palpation of the right paralumbar muscles.  Negative straight leg raise.  Muscle spasm of back and strain of lumbar region.  Offered ketorolac injection but he declined.  Sent naproxen and tizanidine for use at bedtime to pharmacy.  Discussed stretches and use of heat.  Reviewed return and ER precautions.   Final Clinical Impressions(s) / UC Diagnoses   Final diagnoses:   Acute right-sided low back pain without sciatica     Discharge Instructions      BACK PAIN: Stressed avoiding painful activities . RICE (REST, ICE, COMPRESSION, ELEVATION) guidelines reviewed. May alternate ice and heat. Consider use of muscle rubs, Salonpas patches, etc. Use medications as directed including muscle relaxers if prescribed. Take anti-inflammatory medications as prescribed or OTC NSAIDs/Tylenol.  F/u with PCP in 7-10 days for reexamination, and please feel free to call or return to the urgent care at any time for any questions or concerns you may have and we will be happy to help you!   BACK PAIN RED FLAGS: If the back pain acutely worsens or there are any red flag symptoms such as numbness/tingling, leg weakness, saddle anesthesia, or loss of bowel/bladder control, go immediately to the ER. Follow up with us  as scheduled or sooner if the pain does not begin to resolve or if it worsens before the follow up       ED Prescriptions   None    PDMP not reviewed this encounter.   Arvis Jolan NOVAK, PA-C 01/17/24 1029

## 2024-01-17 NOTE — ED Triage Notes (Signed)
 Patient states that he's having pain in the right side of his back x 4 days. Patient states that this happens sometimes but goes away. Patient states that the pain doesn't radiate anywhere. Patient states that this is a sharp pain.

## 2024-01-17 NOTE — Discharge Instructions (Addendum)
 BACK PAIN: Stressed avoiding painful activities . RICE (REST, ICE, COMPRESSION, ELEVATION) guidelines reviewed. May alternate ice and heat. Consider use of muscle rubs, Salonpas patches, etc. Use medications as directed including muscle relaxers if prescribed. Take anti-inflammatory medications as prescribed or OTC NSAIDs/Tylenol.  F/u with PCP in 7-10 days for reexamination, and please feel free to call or return to the urgent care at any time for any questions or concerns you may have and we will be happy to help you!   BACK PAIN RED FLAGS: If the back pain acutely worsens or there are any red flag symptoms such as numbness/tingling, leg weakness, saddle anesthesia, or loss of bowel/bladder control, go immediately to the ER. Follow up with [place] as scheduled or sooner if the pain does not begin to resolve or if it worsens before the follow up
